# Patient Record
Sex: Female | Born: 1965 | Race: Black or African American | Hispanic: No | Marital: Married | Smoking: Never smoker
Health system: Southern US, Community
[De-identification: ages and names within clinical notes are randomized; demographics above are authoritative.]

## PROBLEM LIST (undated history)

## (undated) DIAGNOSIS — Z9889 Other specified postprocedural states: Secondary | ICD-10-CM

## (undated) DIAGNOSIS — T7840XA Allergy, unspecified, initial encounter: Secondary | ICD-10-CM

## (undated) DIAGNOSIS — D649 Anemia, unspecified: Secondary | ICD-10-CM

## (undated) DIAGNOSIS — E559 Vitamin D deficiency, unspecified: Secondary | ICD-10-CM

## (undated) DIAGNOSIS — K219 Gastro-esophageal reflux disease without esophagitis: Secondary | ICD-10-CM

## (undated) DIAGNOSIS — J45909 Unspecified asthma, uncomplicated: Secondary | ICD-10-CM

## (undated) DIAGNOSIS — R112 Nausea with vomiting, unspecified: Secondary | ICD-10-CM

## (undated) HISTORY — PX: HAMMER TOE SURGERY: SHX385

## (undated) HISTORY — DX: Vitamin D deficiency, unspecified: E55.9

## (undated) HISTORY — PX: ABDOMINAL HYSTERECTOMY: SHX81

## (undated) HISTORY — DX: Allergy, unspecified, initial encounter: T78.40XA

---

## 2011-08-09 ENCOUNTER — Ambulatory Visit: Payer: Self-pay | Admitting: Family Medicine

## 2012-09-19 ENCOUNTER — Ambulatory Visit: Payer: Self-pay | Admitting: Family Medicine

## 2012-09-21 ENCOUNTER — Ambulatory Visit: Payer: Self-pay | Admitting: Family Medicine

## 2014-12-24 ENCOUNTER — Other Ambulatory Visit: Payer: Self-pay | Admitting: Physician Assistant

## 2014-12-24 DIAGNOSIS — Z1231 Encounter for screening mammogram for malignant neoplasm of breast: Secondary | ICD-10-CM

## 2015-01-08 ENCOUNTER — Ambulatory Visit
Admission: RE | Admit: 2015-01-08 | Discharge: 2015-01-08 | Disposition: A | Discharge status: Home / Self Care | Payer: No Typology Code available for payment source | Source: Ambulatory Visit | Attending: Physician Assistant | Admitting: Physician Assistant

## 2015-01-08 DIAGNOSIS — Z1231 Encounter for screening mammogram for malignant neoplasm of breast: Secondary | ICD-10-CM | POA: Diagnosis present

## 2015-01-28 ENCOUNTER — Encounter
Admission: RE | Admit: 2015-01-28 | Discharge: 2015-01-28 | Disposition: A | Discharge status: Home / Self Care | Payer: No Typology Code available for payment source | Source: Ambulatory Visit | Attending: Obstetrics and Gynecology | Admitting: Obstetrics and Gynecology

## 2015-01-28 DIAGNOSIS — Z01812 Encounter for preprocedural laboratory examination: Secondary | ICD-10-CM | POA: Insufficient documentation

## 2015-01-28 HISTORY — DX: Gastro-esophageal reflux disease without esophagitis: K21.9

## 2015-01-28 HISTORY — DX: Unspecified asthma, uncomplicated: J45.909

## 2015-01-28 HISTORY — DX: Anemia, unspecified: D64.9

## 2015-01-28 LAB — TYPE AND SCREEN
ABO/RH(D): O POS
ANTIBODY SCREEN: NEGATIVE

## 2015-01-28 LAB — COMPREHENSIVE METABOLIC PANEL
ALK PHOS: 52 U/L (ref 38–126)
ALT: 12 U/L — AB (ref 14–54)
AST: 18 U/L (ref 15–41)
Albumin: 4.5 g/dL (ref 3.5–5.0)
Anion gap: 8 (ref 5–15)
BILIRUBIN TOTAL: 0.8 mg/dL (ref 0.3–1.2)
BUN: 11 mg/dL (ref 6–20)
CO2: 24 mmol/L (ref 22–32)
Calcium: 9.1 mg/dL (ref 8.9–10.3)
Chloride: 106 mmol/L (ref 101–111)
Creatinine, Ser: 0.63 mg/dL (ref 0.44–1.00)
GLUCOSE: 97 mg/dL (ref 65–99)
POTASSIUM: 3.7 mmol/L (ref 3.5–5.1)
SODIUM: 138 mmol/L (ref 135–145)
Total Protein: 7.4 g/dL (ref 6.5–8.1)

## 2015-01-28 LAB — CBC
HEMATOCRIT: 37 % (ref 35.0–47.0)
Hemoglobin: 12.3 g/dL (ref 12.0–16.0)
MCH: 28.8 pg (ref 26.0–34.0)
MCHC: 33.1 g/dL (ref 32.0–36.0)
MCV: 87.1 fL (ref 80.0–100.0)
Platelets: 209 10*3/uL (ref 150–440)
RBC: 4.25 MIL/uL (ref 3.80–5.20)
RDW: 13.1 % (ref 11.5–14.5)
WBC: 6.9 10*3/uL (ref 3.6–11.0)

## 2015-01-28 LAB — ABO/RH: ABO/RH(D): O POS

## 2015-01-28 NOTE — Patient Instructions (Signed)
  Your procedure is scheduled [id number] 5, 2016 (Tuesday) Report to Day Surgery. To find out your arrival time please call [phone] between 1PM - 3PM on January 30, 2015 (Friday).  Remember: Instructions that are not followed completely may result in serious medical risk, up to and including death, or upon the discretion of your surgeon and anesthesiologist your surgery may need to be rescheduled.    __x__ 1. Do not eat food or drink liquids after midnight. No gum chewing or hard candies.     __x__ 2. No Alcohol for 24 hours before or after surgery.   ____ 3. Bring all medications with you on the day of surgery if instructed.    __x__ 4. Notify your doctor if there is any change in your medical condition     (cold, fever, infections).     Do not wear jewelry, make-up, hairpins, clips or nail polish.  Do not wear lotions, powders, or perfumes. You may wear deodorant.  Do not shave 48 hours prior to surgery. Men may shave face and neck.  Do not bring valuables to the hospital.    [facility] is not responsible for any belongings or valuables.               Contacts, dentures or bridgework may not be worn into surgery.  Leave your suitcase in the car. After surgery it may be brought to your room.  For patients admitted to the hospital, discharge time is determined by your                treatment team.   Patients discharged the day of surgery will not be allowed to drive home.   Please read over the following fact sheets that you were given:   Surgical Site Infection Prevention   __x__ Take these medicines the morning of surgery with A SIP OF WATER:    1. Omeprazole (Omeprazole at bedtime on July 4)  2.   3.   4.  5.  6.  ____ Fleet Enema (as directed)   __x__ Use CHG Soap as directed  ____ Use inhalers on the day of surgery  ____ Stop metformin 2 days prior to surgery    ____ Take 1/2 of usual insulin dose the night before surgery and none on the morning of surgery.    ____ Stop Coumadin/Plavix/aspirin on   ____ Stop Anti-inflammatories on    ____ Stop supplements until after surgery.    ____ Bring C-Pap to the hospital.

## 2015-02-03 ENCOUNTER — Encounter
Admission: RE | Disposition: A | Discharge status: Home / Self Care | Payer: Self-pay | Source: Ambulatory Visit | Attending: Obstetrics and Gynecology

## 2015-02-03 ENCOUNTER — Ambulatory Visit
Admission: RE | Admit: 2015-02-03 | Discharge: 2015-02-03 | Disposition: A | Discharge status: Home / Self Care | Payer: PRIVATE HEALTH INSURANCE | Source: Ambulatory Visit | Attending: Obstetrics and Gynecology | Admitting: Obstetrics and Gynecology

## 2015-02-03 ENCOUNTER — Ambulatory Visit: Payer: PRIVATE HEALTH INSURANCE | Admitting: Certified Registered"

## 2015-02-03 ENCOUNTER — Encounter: Payer: Self-pay | Admitting: *Deleted

## 2015-02-03 DIAGNOSIS — N920 Excessive and frequent menstruation with regular cycle: Secondary | ICD-10-CM | POA: Insufficient documentation

## 2015-02-03 DIAGNOSIS — D649 Anemia, unspecified: Secondary | ICD-10-CM | POA: Insufficient documentation

## 2015-02-03 DIAGNOSIS — D259 Leiomyoma of uterus, unspecified: Secondary | ICD-10-CM | POA: Insufficient documentation

## 2015-02-03 DIAGNOSIS — K219 Gastro-esophageal reflux disease without esophagitis: Secondary | ICD-10-CM | POA: Insufficient documentation

## 2015-02-03 DIAGNOSIS — J45909 Unspecified asthma, uncomplicated: Secondary | ICD-10-CM | POA: Diagnosis not present

## 2015-02-03 DIAGNOSIS — N888 Other specified noninflammatory disorders of cervix uteri: Secondary | ICD-10-CM | POA: Diagnosis not present

## 2015-02-03 DIAGNOSIS — N838 Other noninflammatory disorders of ovary, fallopian tube and broad ligament: Secondary | ICD-10-CM | POA: Insufficient documentation

## 2015-02-03 HISTORY — PX: CYSTOSCOPY: SHX5120

## 2015-02-03 HISTORY — PX: LAPAROSCOPIC HYSTERECTOMY: SHX1926

## 2015-02-03 LAB — POCT PREGNANCY, URINE: Preg Test, Ur: NEGATIVE

## 2015-02-03 SURGERY — HYSTERECTOMY, TOTAL, LAPAROSCOPIC
Anesthesia: General

## 2015-02-03 MED ORDER — BUPIVACAINE HCL 0.5 % IJ SOLN
INTRAMUSCULAR | Status: DC | PRN
  Administered 2015-02-03: 15 mL

## 2015-02-03 MED ORDER — GLYCOPYRROLATE 0.2 MG/ML IJ SOLN
INTRAMUSCULAR | Status: DC | PRN
  Administered 2015-02-03: 0.6 mg via INTRAVENOUS

## 2015-02-03 MED ORDER — IBUPROFEN 600 MG PO TABS
600.0000 mg | ORAL_TABLET | Freq: Four times a day (QID) | ORAL | Status: DC | PRN
  Administered 2015-02-03: 600 mg via ORAL
  Filled 2015-02-03 (×2): qty 1

## 2015-02-03 MED ORDER — ONDANSETRON HCL 4 MG/2ML IJ SOLN
INTRAMUSCULAR | Status: AC
  Filled 2015-02-03: qty 2

## 2015-02-03 MED ORDER — OXYCODONE-ACETAMINOPHEN 5-325 MG PO TABS
1.0000 | ORAL_TABLET | ORAL | Status: DC | PRN
Start: 2015-02-03 — End: 2015-02-03
  Administered 2015-02-03: 1 via ORAL

## 2015-02-03 MED ORDER — MIDAZOLAM HCL 2 MG/2ML IJ SOLN
INTRAMUSCULAR | Status: DC | PRN
  Administered 2015-02-03: 2 mg via INTRAVENOUS

## 2015-02-03 MED ORDER — CEFAZOLIN SODIUM-DEXTROSE 2-3 GM-% IV SOLR
INTRAVENOUS | Status: AC
  Administered 2015-02-03: 2 g via INTRAVENOUS
  Filled 2015-02-03: qty 50

## 2015-02-03 MED ORDER — ROCURONIUM BROMIDE 100 MG/10ML IV SOLN
INTRAVENOUS | Status: DC | PRN
  Administered 2015-02-03: 50 mg via INTRAVENOUS

## 2015-02-03 MED ORDER — FENTANYL CITRATE (PF) 100 MCG/2ML IJ SOLN
INTRAMUSCULAR | Status: AC
  Administered 2015-02-03: 25 ug via INTRAVENOUS
  Filled 2015-02-03: qty 2

## 2015-02-03 MED ORDER — FENTANYL CITRATE (PF) 100 MCG/2ML IJ SOLN
INTRAMUSCULAR | Status: DC | PRN
  Administered 2015-02-03 (×2): 50 ug via INTRAVENOUS
  Administered 2015-02-03: 100 ug via INTRAVENOUS
  Administered 2015-02-03: 50 ug via INTRAVENOUS

## 2015-02-03 MED ORDER — IBUPROFEN 600 MG PO TABS: 600.0000 mg | ORAL_TABLET | Freq: Four times a day (QID) | ORAL | Status: DC | PRN

## 2015-02-03 MED ORDER — ACETAMINOPHEN 10 MG/ML IV SOLN
INTRAVENOUS | Status: DC | PRN
  Administered 2015-02-03: 1000 mg via INTRAVENOUS

## 2015-02-03 MED ORDER — ONDANSETRON 4 MG PO TBDP: 4.0000 mg | ORAL_TABLET | Freq: Three times a day (TID) | ORAL | Status: DC | PRN

## 2015-02-03 MED ORDER — LACTATED RINGERS IV SOLN
INTRAVENOUS | Status: DC
  Administered 2015-02-03 (×4): via INTRAVENOUS

## 2015-02-03 MED ORDER — ONDANSETRON HCL 4 MG/2ML IJ SOLN
4.0000 mg | Freq: Once | INTRAMUSCULAR | Status: AC | PRN
  Administered 2015-02-03: 4 mg via INTRAVENOUS

## 2015-02-03 MED ORDER — CEFAZOLIN SODIUM-DEXTROSE 2-3 GM-% IV SOLR
2.0000 g | INTRAVENOUS | Status: AC
  Administered 2015-02-03: 2 g via INTRAVENOUS

## 2015-02-03 MED ORDER — BUPIVACAINE HCL (PF) 0.5 % IJ SOLN
INTRAMUSCULAR | Status: AC
  Filled 2015-02-03: qty 30

## 2015-02-03 MED ORDER — NEOSTIGMINE METHYLSULFATE 10 MG/10ML IV SOLN
INTRAVENOUS | Status: DC | PRN
  Administered 2015-02-03: 3 mg via INTRAVENOUS

## 2015-02-03 MED ORDER — FENTANYL CITRATE (PF) 100 MCG/2ML IJ SOLN
25.0000 ug | INTRAMUSCULAR | Status: DC | PRN
  Administered 2015-02-03 (×4): 25 ug via INTRAVENOUS

## 2015-02-03 MED ORDER — OXYCODONE-ACETAMINOPHEN 5-325 MG PO TABS: 2.0000 | ORAL_TABLET | ORAL | Status: DC | PRN

## 2015-02-03 MED ORDER — ONDANSETRON HCL 4 MG/2ML IJ SOLN
INTRAMUSCULAR | Status: DC | PRN
  Administered 2015-02-03: 4 mg via INTRAVENOUS

## 2015-02-03 MED ORDER — IBUPROFEN 600 MG PO TABS
ORAL_TABLET | ORAL | Status: AC
  Filled 2015-02-03: qty 1

## 2015-02-03 MED ORDER — DEXAMETHASONE SODIUM PHOSPHATE 4 MG/ML IJ SOLN
INTRAMUSCULAR | Status: DC | PRN
  Administered 2015-02-03: 5 mg via INTRAVENOUS

## 2015-02-03 MED ORDER — ACETAMINOPHEN 10 MG/ML IV SOLN
INTRAVENOUS | Status: AC
  Filled 2015-02-03: qty 100

## 2015-02-03 MED ORDER — OXYCODONE-ACETAMINOPHEN 5-325 MG PO TABS
ORAL_TABLET | ORAL | Status: AC
  Filled 2015-02-03: qty 1

## 2015-02-03 MED ORDER — LIDOCAINE HCL (CARDIAC) 20 MG/ML IV SOLN
INTRAVENOUS | Status: DC | PRN
  Administered 2015-02-03: 50 mg via INTRAVENOUS

## 2015-02-03 MED ORDER — PROPOFOL 10 MG/ML IV BOLUS
INTRAVENOUS | Status: DC | PRN
  Administered 2015-02-03: 180 mg via INTRAVENOUS

## 2015-02-03 SURGICAL SUPPLY — 58 items
BAG URO DRAIN 2000ML W/SPOUT (MISCELLANEOUS) ×3 IMPLANT
BLADE SURG SZ11 CARB STEEL (BLADE) ×3 IMPLANT
CATH FOLEY 2WAY  5CC 16FR (CATHETERS) ×2
CATH ROBINSON RED A/P 16FR (CATHETERS) IMPLANT
CATH URTH 16FR FL 2W BLN LF (CATHETERS) ×1 IMPLANT
CHLORAPREP W/TINT 26ML (MISCELLANEOUS) ×3 IMPLANT
COVER LIGHT HANDLE STERIS (MISCELLANEOUS) ×6 IMPLANT
DEFOGGER SCOPE WARMER CLEARIFY (MISCELLANEOUS) ×3 IMPLANT
DEVICE SUTURE ENDOST 10MM (ENDOMECHANICALS) ×3 IMPLANT
DEVICE TROCAR PUNCTURE CLOSURE (ENDOMECHANICALS) ×3 IMPLANT
DRAPE LEGGINS SURG 28X43 STRL (DRAPES) ×3 IMPLANT
DRAPE SHEET LG 3/4 BI-LAMINATE (DRAPES) ×3 IMPLANT
DRAPE UNDER BUTTOCK W/FLU (DRAPES) ×3 IMPLANT
DRAPE XRAY CASSETTE 23X24 (DRAPES) IMPLANT
ENDOSTITCH 0 SINGLE 48 (SUTURE) IMPLANT
GLOVE BIO SURGEON STRL SZ7 (GLOVE) ×9 IMPLANT
GLOVE BIOGEL PI IND STRL 7.5 (GLOVE) ×3 IMPLANT
GLOVE BIOGEL PI INDICATOR 7.5 (GLOVE) ×6
GLOVE INDICATOR 7.5 STRL GRN (GLOVE) ×9 IMPLANT
GOWN STRL REUS W/ TWL LRG LVL3 (GOWN DISPOSABLE) ×3 IMPLANT
GOWN STRL REUS W/ TWL XL LVL3 (GOWN DISPOSABLE) ×1 IMPLANT
GOWN STRL REUS W/TWL LRG LVL3 (GOWN DISPOSABLE) ×6
GOWN STRL REUS W/TWL XL LVL3 (GOWN DISPOSABLE) ×2
GRASPER SUT TROCAR 14GX15 (MISCELLANEOUS) ×3 IMPLANT
IRRIGATION STRYKERFLOW (MISCELLANEOUS) ×1 IMPLANT
IRRIGATOR STRYKERFLOW (MISCELLANEOUS) ×3
IV LACTATED RINGERS 1000ML (IV SOLUTION) ×6 IMPLANT
JELLY LUB 2OZ STRL (MISCELLANEOUS) ×2
JELLY LUBE 2OZ STRL (MISCELLANEOUS) ×1 IMPLANT
KIT RM TURNOVER CYSTO AR (KITS) ×3 IMPLANT
LABEL OR SOLS (LABEL) ×3 IMPLANT
LIGASURE BLUNT 5MM 37CM (INSTRUMENTS) ×3 IMPLANT
LIQUID BAND (GAUZE/BANDAGES/DRESSINGS) ×3 IMPLANT
MANIPULATOR VCARE LG CRV RETR (MISCELLANEOUS) ×3 IMPLANT
MANIPULATOR VCARE STD CRV RETR (MISCELLANEOUS) IMPLANT
NDL SAFETY 22GX1.5 (NEEDLE) ×3 IMPLANT
OCCLUDER COLPOPNEUMO (BALLOONS) ×3 IMPLANT
PACK CYSTO AR (MISCELLANEOUS) ×3 IMPLANT
PACK LAP CHOLECYSTECTOMY (MISCELLANEOUS) ×3 IMPLANT
PAD OB MATERNITY 4.3X12.25 (PERSONAL CARE ITEMS) ×3 IMPLANT
PAD PREP 24X41 OB/GYN DISP (PERSONAL CARE ITEMS) ×3 IMPLANT
SCISSORS METZENBAUM CVD 33 (INSTRUMENTS) ×3 IMPLANT
SET CYSTO W/LG BORE CLAMP LF (SET/KITS/TRAYS/PACK) ×3 IMPLANT
SHEARS HARMONIC ACE PLUS 36CM (ENDOMECHANICALS) IMPLANT
SLEEVE ENDOPATH XCEL 5M (ENDOMECHANICALS) ×3 IMPLANT
SLEEVE PROTECTION STRL DISP (MISCELLANEOUS) ×3 IMPLANT
SOL PREP PVP 2OZ (MISCELLANEOUS) ×3
SOLUTION PREP PVP 2OZ (MISCELLANEOUS) ×1 IMPLANT
SPONGE LAP 18X18 5 PK (GAUZE/BANDAGES/DRESSINGS) ×3 IMPLANT
SPONGE XRAY 4X4 16PLY STRL (MISCELLANEOUS) ×3 IMPLANT
SUT ENDO VLOC 180-0-8IN (SUTURE) ×3 IMPLANT
SUT VIC AB 0 CT1 36 (SUTURE) IMPLANT
SYR 50ML LL SCALE MARK (SYRINGE) ×3 IMPLANT
SYRINGE 10CC LL (SYRINGE) ×6 IMPLANT
TROCAR ENDO BLADELESS 11MM (ENDOMECHANICALS) ×3 IMPLANT
TROCAR XCEL NON-BLD 5MMX100MML (ENDOMECHANICALS) ×3 IMPLANT
TUBING INSUFFLATOR HEATED (MISCELLANEOUS) ×3 IMPLANT
WATER STERILE IRR 3000ML UROMA (IV SOLUTION) ×3 IMPLANT

## 2015-02-03 NOTE — Anesthesia Procedure Notes (Signed)
Procedure Name: Intubation Date/Time: 02/03/2015 10:06 AM Performed by: Rolla Plate Pre-anesthesia Checklist: Patient identified, Patient being monitored, Timeout performed, Emergency Drugs available and Suction available Patient Re-evaluated:Patient Re-evaluated prior to inductionOxygen Delivery Method: Circle system utilized Preoxygenation: Pre-oxygenation with 100% oxygen Intubation Type: IV induction Ventilation: Mask ventilation without difficulty Laryngoscope Size: Miller and 2 Grade View: Grade I Tube type: Oral Tube size: 7.0 mm Number of attempts: 1 Airway Equipment and Method: Stylet Placement Confirmation: ETT inserted through vocal cords under direct vision,  positive ETCO2 and breath sounds checked- equal and bilateral Secured at: 21 cm Tube secured with: Tape Dental Injury: Teeth and Oropharynx as per pre-operative assessment

## 2015-02-03 NOTE — Anesthesia Preprocedure Evaluation (Signed)
Anesthesia Evaluation  Patient identified by MRN, date of birth, ID band Patient awake    Reviewed: Allergy & Precautions, NPO status , Patient's Chart, lab work & pertinent test results  Airway Mallampati: II  TM Distance: >3 FB Neck ROM: Full    Dental no notable dental hx. (+) Caps   Pulmonary asthma ,    Pulmonary exam normal       Cardiovascular negative cardio ROS Normal cardiovascular exam    Neuro/Psych negative neurological ROS  negative psych ROS   GI/Hepatic Neg liver ROS, GERD-  ,  Endo/Other  negative endocrine ROS  Renal/GU negative Renal ROS  negative genitourinary   Musculoskeletal negative musculoskeletal ROS (+)   Abdominal Normal abdominal exam  (+)   Peds negative pediatric ROS (+)  Hematology  (+) anemia ,   Anesthesia Other Findings   Reproductive/Obstetrics negative OB ROS                             Anesthesia Physical Anesthesia Plan  ASA: II  Anesthesia Plan: General   Post-op Pain Management:    Induction: Intravenous  Airway Management Planned: Oral ETT  Additional Equipment:   Intra-op Plan:   Post-operative Plan: Extubation in OR  Informed Consent: I have reviewed the patients History and Physical, chart, labs and discussed the procedure including the risks, benefits and alternatives for the proposed anesthesia with the patient or authorized representative who has indicated his/her understanding and acceptance.   Dental advisory given  Plan Discussed with: CRNA and Surgeon  Anesthesia Plan Comments:         Anesthesia Quick Evaluation

## 2015-02-03 NOTE — Discharge Instructions (Signed)

## 2015-02-03 NOTE — Op Note (Signed)
Operative Note   02/03/2015 12:32 PM  PRE-OP DIAGNOSIS:  1) menorrhagia 2) fibroid uterus   POST-OP DIAGNOSIS:  1) menorrhagia 2) fibroid uterus   SURGEON: Surgeon(s) and Role:    * Will Bonnet, MD - Primary    * Aletha Halim, MD - Assisting  ANESTHESIA: General ET  PROCEDURE: Procedure(s): 1) Removal of Intrauterine Device 2) HYSTERECTOMY TOTAL LAPAROSCOPIC/BILATERAL SALPINGECTOMY 3) CYSTOSCOPY   ESTIMATED BLOOD LOSS: 75 mL  DRAINS: None   TOTAL IV FLUIDS: 1,000 mL crystalloid  SPECIMENS:  1) Uterus with cervix 2) Bilateral fallopian tubes  COMPLICATIONS: None  DISPOSITION: PACU - hemodynamically stable.  CONDITION: stable  PROCEDURE IN DETAIL:  After informed consent was obtained, the patient was taken to the operating room where anesthesia was obtained without difficulty. The patient was positioned in the dorsal lithotomy position in [place] and her arms were carefully tucked at her sides and the usual precautions were taken.  She was prepped and draped in normal sterile fashion.  Time-out was performed and a Foley catheter was placed into the bladder.  The intrauterine device string was visualized and grasped with a ring forcep. Gentle traction was applied and the IUD was removed without incident. A standard VCare uterine manipulator was placed in the uterus without incident.    Attention was turned to the abdomen where after injection of local anesthesia a 5 mm supra-umbilical port was placed using directly visualization with the Optiview trocar method. Verification of entry into the abdominal cavity was obtained via opening pressures. The laparoscope was introduced and CO2 gas was infused for pneumoperitoneum to a pressure of 15 mm Hg.  Atraumatic entry was verified. A right 11 mm port and a left 5 mm port were placed under direct intra-abdominal camera visualization of the laparoscope without difficulty.  The patient was placed in Trendelenburg and the  bowel was displaced up into the upper abdomen.  Round ligaments were divided on each side with the LigaSure and the retroperitoneal space was opened bilaterally.  The ureters were identified and preserved.  The utero-ovarian ligaments were skeletonized, sealed and divided with the LigaSure device.  A bladder flap was created and the bladder was dissected down off the lower uterine segment and cervix using electrocautery.  The uterine arteries were skeletonized bilaterally, sealed and divided with the LigaSure device.  A colpotomy was performed circumferentially along the V-Care ring with electrocautery and the cervix was incised from the vagina and the specimen was removed through the vagina.  A pneumo balloon was placed in the vagina and the vaginal cuff was then closed in a running continuous fashion using the EndoStitch technique with 0 V-Lock suture with careful attention to include the vaginal cuff angles and the vaginal epithelium within the closure.  The remaining fallopian tube segments were removed along the mesosalpinx using the LigaSure without difficulty.  At this point cystoscopy was performed. The Foley catheter was removed and the cystoscope was introduced into the bladder. The entire bladder was visualized and efflux of urine from the bilateralureteral orifices was visualized. No defects were noted in the bladder. The cystoscope was removed and the Foley catheter was replaced.  Attention was again turned to the abdominal portion of the case and visualization of the vascular pedicles was undertaken. Hemostasis was observed. The intraperitoneal pressure was dropped, and all planes of dissection, vascular pedicles and the vaginal cuff were found to be hemostatic.  The right lateral trocar was removed and the fascia was closed using a fascial  closure device. Before the umbilical and left lateral trocars were removed the CO2 gas was released.  The skin incisions were closed with Indermil glue.  Prior  to leaving the operating room the Foley catheter was removed. The patient tolerated the procedure well.  Sponge, lap and needle counts were correct x2.  The patient was taken to recovery room in excellent condition.  Antibiotics: She received 2 g of Ancef IV within 1 hour of the beginning of the procedure.  VTE prophylaxis: The patient was wearing SCDs throughout the entire case.  Will Bonnet, MD, [place] 02/03/2015 12:41 PM

## 2015-02-03 NOTE — H&P (Signed)
History and Physical Interval Note:  Alexandria Anderson  has presented today for surgery, with the diagnosis of MENORRHAGIA  The various methods of treatment have been discussed with the patient and family. After consideration of risks, benefits and other options for treatment, the patient has consented to  Procedure(s): HYSTERECTOMY TOTAL LAPAROSCOPIC/BILATERAL SALPINGECTOMY (N/A) CYSTOSCOPY (N/A) as a surgical intervention .  The patient's history has been reviewed, patient examined, no change in status, stable for surgery.  I have reviewed the patient's chart and labs.  Questions were answered to the patient's satisfaction.    The patient does not take a beta blocker and one is not indicated for this surgery.  Will Bonnet, MD, [place] 02/03/2015 9:53 AM

## 2015-02-03 NOTE — Transfer of Care (Signed)
Immediate Anesthesia Transfer of Care Note  Patient: Alexandria Anderson  Procedure(s) Performed: Procedure(s): HYSTERECTOMY TOTAL LAPAROSCOPIC/BILATERAL SALPINGECTOMY (N/A) CYSTOSCOPY (N/A)  Patient Location: PACU  Anesthesia Type:General  Level of Consciousness: awake  Airway & Oxygen Therapy: Patient Spontanous Breathing and Patient connected to face mask oxygen  Post-op Assessment: Report given to RN  Post vital signs: Reviewed  Last Vitals:  Filed Vitals:   02/03/15 1248  BP: 124/81  Pulse: 76  Temp: 36.6 C  Resp:     Complications: No apparent anesthesia complications

## 2015-02-04 ENCOUNTER — Encounter: Payer: Self-pay | Admitting: Obstetrics and Gynecology

## 2015-02-04 LAB — SURGICAL PATHOLOGY

## 2015-02-04 NOTE — Anesthesia Postprocedure Evaluation (Signed)
  Anesthesia Post-op Note  Patient: Alexandria Anderson  Procedure(s) Performed: Procedure(s): HYSTERECTOMY TOTAL LAPAROSCOPIC/BILATERAL SALPINGECTOMY (N/A) CYSTOSCOPY (N/A)  Anesthesia type:General  Patient location: PACU  Post pain: Pain level controlled  Post assessment: Post-op Vital signs reviewed, Patient's Cardiovascular Status Stable, Respiratory Function Stable, Patent Airway and No signs of Nausea or vomiting  Post vital signs: Reviewed and stable  Last Vitals:  Filed Vitals:   02/03/15 1616  BP: 125/83  Pulse:   Temp:   Resp: 16    Level of consciousness: awake, alert  and patient cooperative  Complications: No apparent anesthesia complications

## 2015-03-09 ENCOUNTER — Other Ambulatory Visit: Payer: Self-pay | Admitting: Physician Assistant

## 2015-03-09 ENCOUNTER — Ambulatory Visit
Admission: RE | Admit: 2015-03-09 | Discharge: 2015-03-09 | Disposition: A | Discharge status: Home / Self Care | Payer: PRIVATE HEALTH INSURANCE | Source: Ambulatory Visit | Attending: Physician Assistant | Admitting: Physician Assistant

## 2015-03-09 DIAGNOSIS — M25551 Pain in right hip: Secondary | ICD-10-CM | POA: Diagnosis present

## 2015-03-09 DIAGNOSIS — M79641 Pain in right hand: Secondary | ICD-10-CM

## 2015-06-09 ENCOUNTER — Ambulatory Visit: Payer: Self-pay | Admitting: Physician Assistant

## 2015-06-09 ENCOUNTER — Encounter: Payer: Self-pay | Admitting: Physician Assistant

## 2015-06-09 VITALS — BP 110/70 | HR 92 | Temp 98.6°F

## 2015-06-09 DIAGNOSIS — M542 Cervicalgia: Secondary | ICD-10-CM

## 2015-06-09 MED ORDER — MELOXICAM 15 MG PO TABS: 15.0000 mg | ORAL_TABLET | Freq: Every day | ORAL | Status: DC

## 2015-06-09 MED ORDER — CYCLOBENZAPRINE HCL 10 MG PO TABS: 10.0000 mg | ORAL_TABLET | Freq: Three times a day (TID) | ORAL | Status: DC | PRN

## 2015-06-09 MED ORDER — BACLOFEN 10 MG PO TABS: 10.0000 mg | ORAL_TABLET | Freq: Three times a day (TID) | ORAL | Status: DC

## 2015-06-09 NOTE — Progress Notes (Signed)
S: c/o neck and shoulder pain, hx of chronic spasms in same area, tried flexeril without relief, no numbness or tingling, doesn't think she injured herself or is under more stress  O: vitals wnl, nad, cspine nontender, trapezious muscles tender, full rom of neck, n/v intact  A: muscle spasms, neck pain  P: use massage therapy, wet heat, mobic 15mg  qd, flexeril 10mg  tid if at home, baclofen 10mg  tid if at work

## 2015-07-17 ENCOUNTER — Ambulatory Visit: Payer: Self-pay | Admitting: Physician Assistant

## 2015-07-31 ENCOUNTER — Ambulatory Visit: Payer: Self-pay | Admitting: Physician Assistant

## 2015-07-31 ENCOUNTER — Encounter: Payer: Self-pay | Admitting: Physician Assistant

## 2015-07-31 VITALS — BP 110/70 | HR 91 | Temp 98.0°F

## 2015-07-31 DIAGNOSIS — J018 Other acute sinusitis: Secondary | ICD-10-CM

## 2015-07-31 MED ORDER — AZITHROMYCIN 250 MG PO TABS: ORAL_TABLET | ORAL | Status: DC

## 2015-07-31 MED ORDER — FLUTICASONE PROPIONATE 50 MCG/ACT NA SUSP: 2.0000 | Freq: Every day | NASAL | Status: DC

## 2015-07-31 NOTE — Progress Notes (Signed)
S: C/o runny nose and congestion for 3 days, no fever, chills, cp/sob, v/d; mucus is mainly clear, eyes have been runny.   Using otc meds:   O: PE: vitals wnl, nad,  perrl eomi, conjunctiva wnl, normocephalic, tms dull, nasal mucosa boggy and swollen, throat injected, neck supple no lymph, lungs c t a, cv rrr, neuro intact  A:  Acute sinusitis, allergies   P: flonase, pataday opth gtts, if worsening and mucus changes color use zpack; drink fluids, continue regular meds , use otc meds of choice, return if not improving in 5 days, return earlier if worsening

## 2015-08-29 ENCOUNTER — Other Ambulatory Visit: Payer: Self-pay | Admitting: Physician Assistant

## 2015-09-24 ENCOUNTER — Ambulatory Visit: Payer: Self-pay | Admitting: Physician Assistant

## 2015-09-25 ENCOUNTER — Encounter: Payer: Self-pay | Admitting: Physician Assistant

## 2015-09-25 ENCOUNTER — Ambulatory Visit: Payer: Self-pay | Admitting: Physician Assistant

## 2015-09-25 VITALS — BP 110/60 | HR 84 | Temp 98.5°F

## 2015-09-25 DIAGNOSIS — Z9109 Other allergy status, other than to drugs and biological substances: Secondary | ICD-10-CM

## 2015-09-25 DIAGNOSIS — Z299 Encounter for prophylactic measures, unspecified: Secondary | ICD-10-CM

## 2015-09-25 DIAGNOSIS — M25511 Pain in right shoulder: Secondary | ICD-10-CM

## 2015-09-25 MED ORDER — IBUPROFEN 600 MG PO TABS: 600.0000 mg | ORAL_TABLET | Freq: Four times a day (QID) | ORAL | Status: DC | PRN

## 2015-09-25 MED ORDER — CETIRIZINE-PSEUDOEPHEDRINE ER 5-120 MG PO TB12: 1.0000 | ORAL_TABLET | Freq: Two times a day (BID) | ORAL | Status: DC

## 2015-09-25 NOTE — Progress Notes (Signed)
S: c/o r shoulder pain, hurts with movement, will get better during the day, but at night when she rolls over on her shoulder or reaches out it really hurts, no swelling, no known injury, also ? Refill on ibuprofen 600 and zyrtec d  O: vitals wnl, nad, lungs c t a, cv rrr, r shoulder neg for bony tenderness, full rom, no spasms noted, n/v intact  A: acute bursitis, seasonal allergies  P: ibuprofen 600mg  , do not take if taking meloxicam; zyrtec, refer to sports med

## 2015-11-11 ENCOUNTER — Encounter: Payer: Self-pay | Admitting: Physician Assistant

## 2015-11-11 ENCOUNTER — Ambulatory Visit: Payer: Self-pay | Admitting: Physician Assistant

## 2015-11-11 VITALS — BP 100/60 | HR 75 | Temp 98.4°F

## 2015-11-11 DIAGNOSIS — J3089 Other allergic rhinitis: Secondary | ICD-10-CM

## 2015-11-11 MED ORDER — PSEUDOEPHEDRINE HCL 60 MG PO TABS: 60.0000 mg | ORAL_TABLET | Freq: Four times a day (QID) | ORAL | Status: DC | PRN

## 2015-11-11 MED ORDER — FEXOFENADINE HCL 180 MG PO TABS: 180.0000 mg | ORAL_TABLET | Freq: Every day | ORAL | Status: DC

## 2015-11-11 NOTE — Progress Notes (Signed)
S: C/o runny nose and congestion for 3 days, ear pressure, started using zyrtec and zyrtec d, now is really dried out, no fever, chills, cp/sob, v/d; mucus is clear; Using otc meds:   O: PE: vitals wnl, nad, perrl eomi, normocephalic, tms dull, nasal mucosa pale, boggy, swollen, throat injected, neck supple no lymph, lungs c t a, cv rrr, neuro intact  A:  Acute seasonal allergies, allergic rhinitis   P: allegra, stop zyrtec, or use only 1 a day on opposite time of day as allegra, flonase, rx for sudafed if congested, if not congested just use allergy med; drink fluids, continue regular meds , use otc meds of choice, return if not improving in 5 days, return earlier if worsening

## 2015-12-09 IMAGING — CR DG HIP (WITH OR WITHOUT PELVIS) 2-3V*R*
1 series · 3 of 3 positions shown · non-contrast
Comparison: None.

CLINICAL DATA: Initial encounter for two month history of hip pain
without injury.

EXAM:
DG HIP (WITH OR WITHOUT PELVIS) 2-3V RIGHT

[Series 1: view not recorded · 0.14mm/px · 3 of 3 slices shown]
[im 1/3]
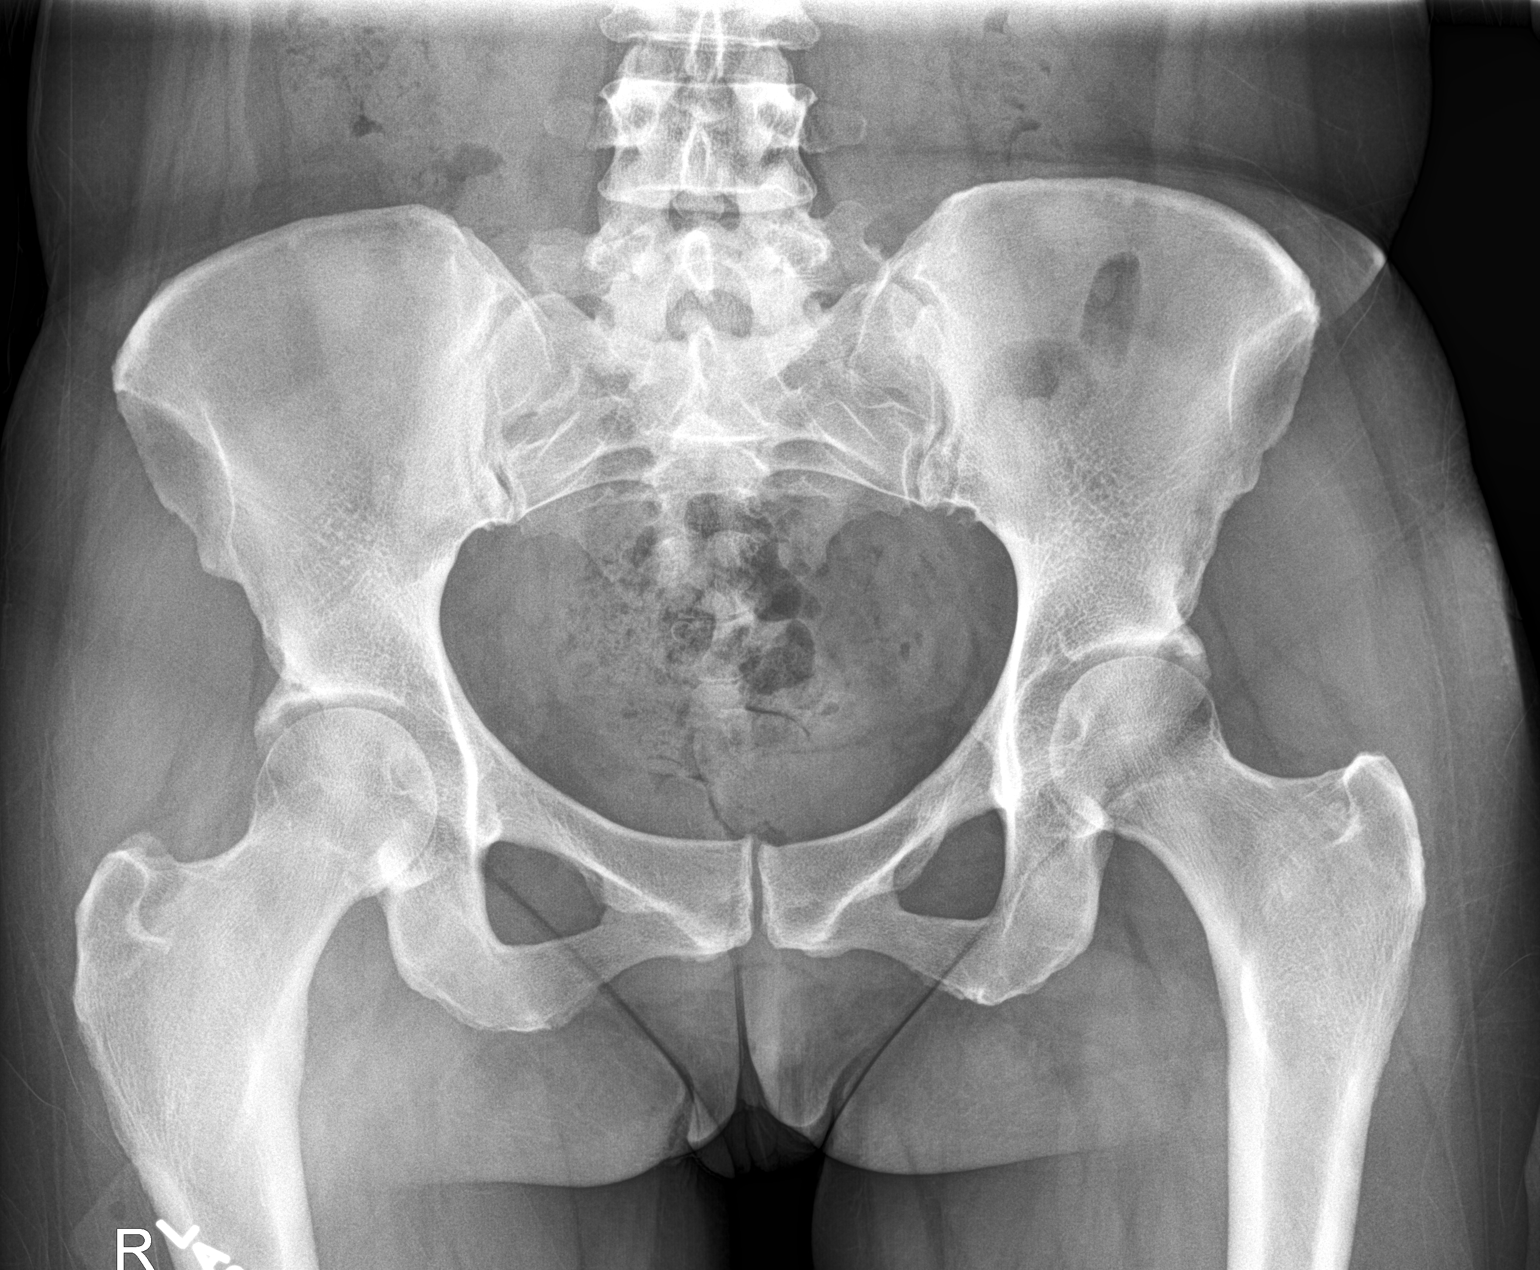
[im 2/3]
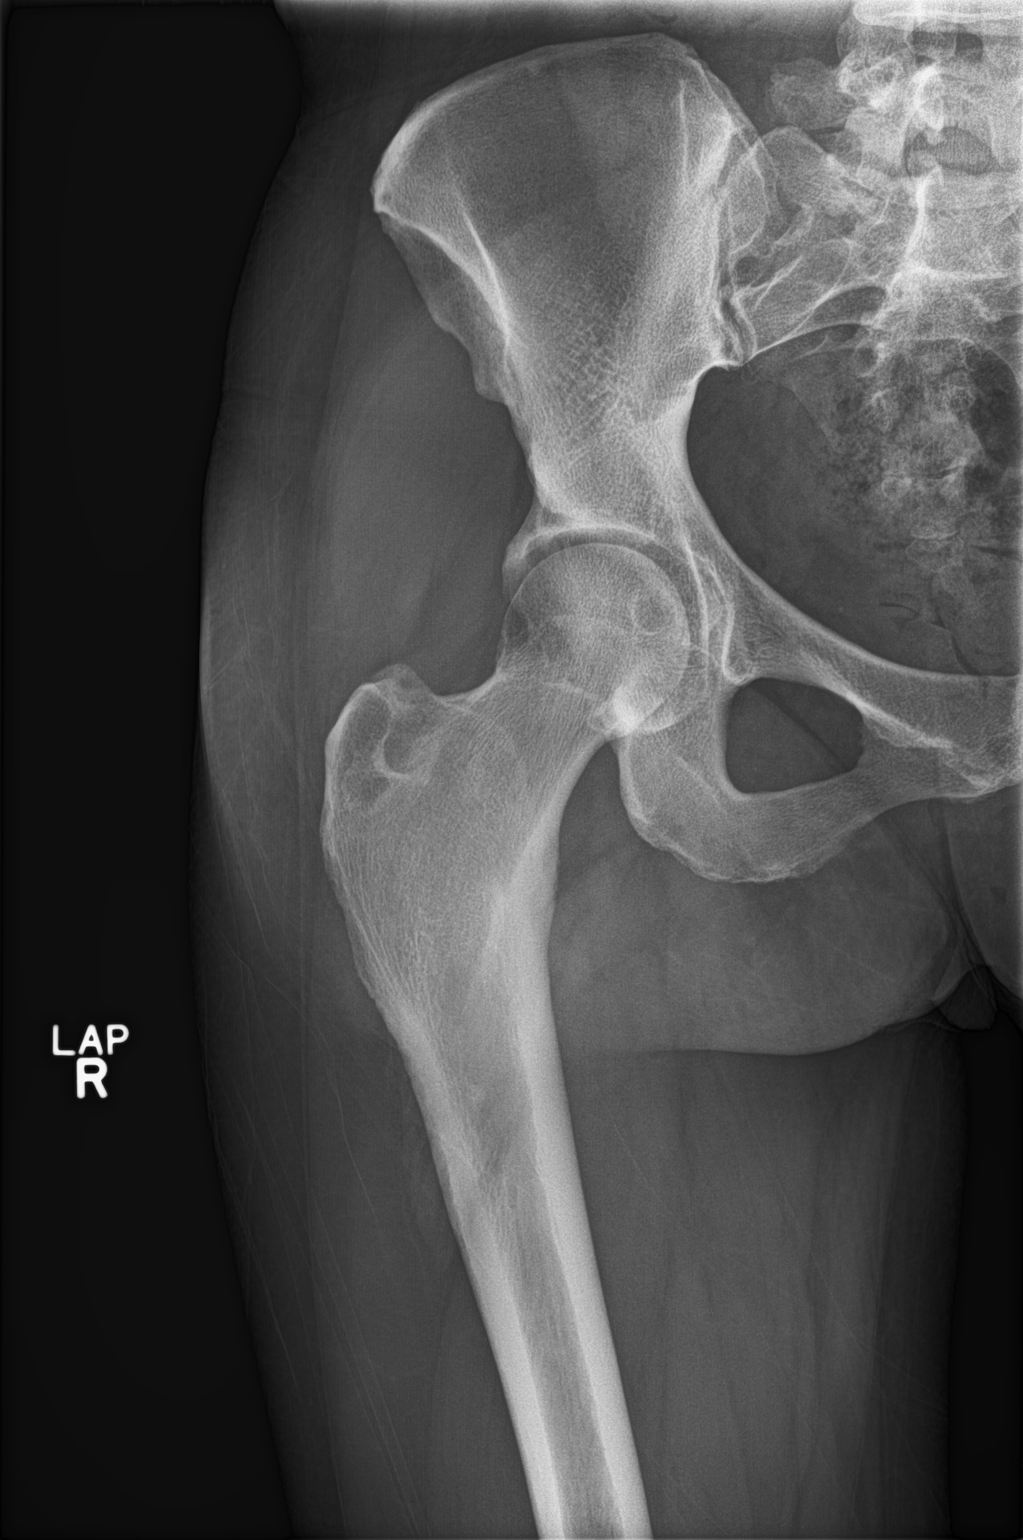
[im 3/3]
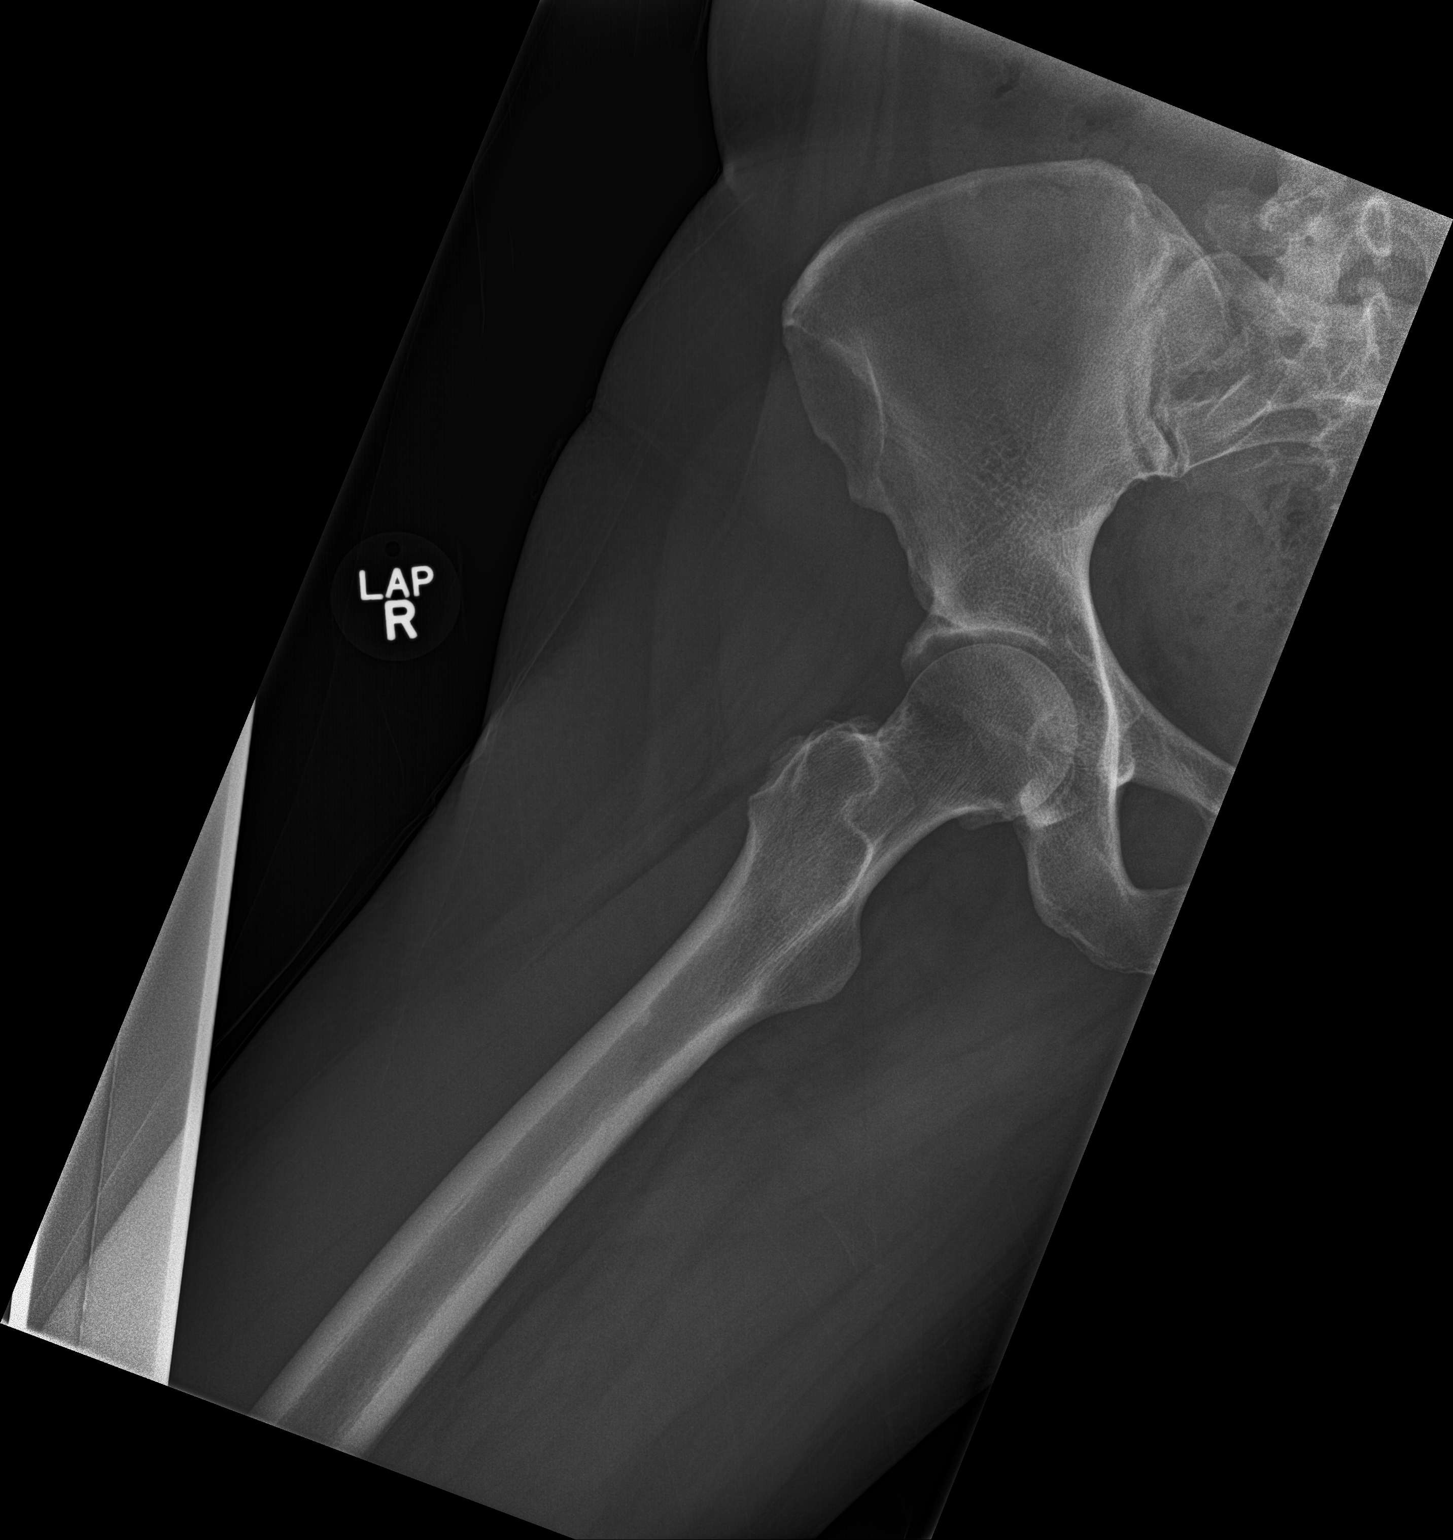

[3 of 3 positions shown; findings below may reference images not displayed]

FINDINGS: Frontal pelvis with AP and frog-leg lateral views of the right hip
show no fracture. Joint space in the hips is symmetric and
relatively well preserved. No substantial degenerative change. SI
joints and symphysis pubis are unremarkable.
IMPRESSION: Negative.

## 2015-12-14 ENCOUNTER — Other Ambulatory Visit: Payer: Self-pay | Admitting: Physician Assistant

## 2015-12-16 NOTE — Telephone Encounter (Signed)
Med refill approved 

## 2016-01-25 ENCOUNTER — Encounter: Payer: Self-pay | Admitting: Physician Assistant

## 2016-01-25 ENCOUNTER — Ambulatory Visit: Payer: Self-pay | Admitting: Physician Assistant

## 2016-01-25 VITALS — BP 122/70 | HR 94 | Temp 98.8°F

## 2016-01-25 DIAGNOSIS — B379 Candidiasis, unspecified: Secondary | ICD-10-CM

## 2016-01-25 MED ORDER — TERCONAZOLE 0.4 % VA CREA: 1.0000 | TOPICAL_CREAM | Freq: Every day | VAGINAL | Status: DC

## 2016-01-25 MED ORDER — ALIGN 4 MG PO CAPS: 1.0000 | ORAL_CAPSULE | Freq: Every morning | ORAL | Status: DC

## 2016-01-25 MED ORDER — FLUCONAZOLE 150 MG PO TABS: ORAL_TABLET | ORAL | Status: DC

## 2016-01-25 NOTE — Progress Notes (Signed)
S: here to discuss freq yeast infections, gyn doctor makes her come in to office every time she has yeast, has done multiple tests and its always yeast, got a bill from [facility] for 230$ after her last visit, did not do blood work, just vaginal swabs, ?if there is something else she can do, states she will take a diflucan and use otc monistat and it won't clear up, has to use terconazole suppositories, hasn't tried repeating the diflucan after a week, doesn't take a probiotic or eat yogurt daily, no hx of diabetes or hiv  O: vitals wnl, nad, lungs c t a, cv rrr  A: candidiasis by hx  P: otc align probiotic, diflucan 1 now and 1 in a week, if doesn't work get rx for terconazole filled, f/u with gyn

## 2016-02-03 ENCOUNTER — Encounter: Payer: Self-pay | Admitting: Physician Assistant

## 2016-02-03 ENCOUNTER — Ambulatory Visit: Payer: Self-pay | Admitting: Physician Assistant

## 2016-02-03 VITALS — BP 119/70 | HR 77 | Temp 98.8°F

## 2016-02-03 DIAGNOSIS — R1011 Right upper quadrant pain: Secondary | ICD-10-CM

## 2016-02-03 DIAGNOSIS — E559 Vitamin D deficiency, unspecified: Secondary | ICD-10-CM

## 2016-02-03 NOTE — Progress Notes (Signed)
S: c/o ruq pain, no fever/chills, some nausea, pain is under her rib and is constant, no fever/chills/uti sx; states is constant so can't tell if its related to food, sx for 1/5 weeks, still has gallbladder, no v/d  O: vitals wnl, nad, lungs c t a, cv rrr, abd soft nontender bs normal all 4 quads, n/v intact  A: ruq pain  P: [place] ruq, labs, refer to GI if needed

## 2016-02-04 LAB — CMP12+LP+TP+TSH+6AC+CBC/D/PLT
ALT: 11 IU/L (ref 0–32)
AST: 11 IU/L (ref 0–40)
Albumin/Globulin Ratio: 1.7 (ref 1.2–2.2)
Albumin: 4.7 g/dL (ref 3.5–5.5)
Alkaline Phosphatase: 61 IU/L (ref 39–117)
BASOS: 0 %
BUN / CREAT RATIO: 18 (ref 9–23)
BUN: 13 mg/dL (ref 6–24)
Basophils Absolute: 0 10*3/uL (ref 0.0–0.2)
Bilirubin Total: 0.3 mg/dL (ref 0.0–1.2)
CALCIUM: 9.1 mg/dL (ref 8.7–10.2)
Chloride: 102 mmol/L (ref 96–106)
Chol/HDL Ratio: 2.9 ratio units (ref 0.0–4.4)
Cholesterol, Total: 144 mg/dL (ref 100–199)
Creatinine, Ser: 0.73 mg/dL (ref 0.57–1.00)
EOS (ABSOLUTE): 0 10*3/uL (ref 0.0–0.4)
Eos: 1 %
Estimated CHD Risk: <0.5 times avg. (ref 0.0–1.0)
Free Thyroxine Index: 1.4 (ref 1.2–4.9)
GFR calc non Af Amer: 98 mL/min/{1.73_m2} (ref >59)
GFR, EST AFRICAN AMERICAN: 113 mL/min/{1.73_m2} (ref >59)
GGT: 38 IU/L (ref 0–60)
GLOBULIN, TOTAL: 2.8 g/dL (ref 1.5–4.5)
GLUCOSE: 84 mg/dL (ref 65–99)
HDL: 50 mg/dL (ref >39)
HEMATOCRIT: 37.3 % (ref 34.0–46.6)
Hemoglobin: 12.7 g/dL (ref 11.1–15.9)
IMMATURE GRANS (ABS): 0 10*3/uL (ref 0.0–0.1)
Immature Granulocytes: 0 %
Iron: 63 ug/dL (ref 27–159)
LDH: 155 IU/L (ref 119–226)
LDL CALC: 81 mg/dL (ref 0–99)
LYMPHS: 38 %
Lymphocytes Absolute: 2.7 10*3/uL (ref 0.7–3.1)
MCH: 28.7 pg (ref 26.6–33.0)
MCHC: 34 g/dL (ref 31.5–35.7)
MCV: 84 fL (ref 79–97)
MONOCYTES: 6 %
MONOS ABS: 0.4 10*3/uL (ref 0.1–0.9)
Neutrophils Absolute: 3.9 10*3/uL (ref 1.4–7.0)
Neutrophils: 55 %
POTASSIUM: 4.5 mmol/L (ref 3.5–5.2)
Phosphorus: 3.1 mg/dL (ref 2.5–4.5)
Platelets: 254 10*3/uL (ref 150–379)
RBC: 4.42 x10E6/uL (ref 3.77–5.28)
RDW: 14.6 % (ref 12.3–15.4)
SODIUM: 138 mmol/L (ref 134–144)
T3 Uptake Ratio: 27 % (ref 24–39)
T4, Total: 5.2 ug/dL (ref 4.5–12.0)
TRIGLYCERIDES: 64 mg/dL (ref 0–149)
TSH: 0.752 u[IU]/mL (ref 0.450–4.500)
Total Protein: 7.5 g/dL (ref 6.0–8.5)
Uric Acid: 4.2 mg/dL (ref 2.5–7.1)
VLDL Cholesterol Cal: 13 mg/dL (ref 5–40)
WBC: 7 10*3/uL (ref 3.4–10.8)

## 2016-02-04 LAB — LIPASE: Lipase: 27 U/L (ref 0–59)

## 2016-02-04 LAB — VITAMIN D 25 HYDROXY (VIT D DEFICIENCY, FRACTURES): Vit D, 25-Hydroxy: 12.7 ng/mL — ABNORMAL LOW (ref 30.0–100.0)

## 2016-02-05 ENCOUNTER — Ambulatory Visit
Admission: RE | Admit: 2016-02-05 | Discharge: 2016-02-05 | Disposition: A | Discharge status: Home / Self Care | Payer: Managed Care, Other (non HMO) | Source: Ambulatory Visit | Attending: Physician Assistant | Admitting: Physician Assistant

## 2016-02-05 DIAGNOSIS — R1011 Right upper quadrant pain: Secondary | ICD-10-CM | POA: Insufficient documentation

## 2016-02-05 NOTE — Addendum Note (Signed)
Addended by: Rudene Anda T on: 02/05/2016 02:57 PM   Modules accepted: Orders

## 2016-02-18 ENCOUNTER — Encounter: Payer: Self-pay | Admitting: Physician Assistant

## 2016-02-18 ENCOUNTER — Ambulatory Visit: Payer: Self-pay | Admitting: Physician Assistant

## 2016-02-18 VITALS — BP 100/70 | HR 97 | Temp 98.6°F

## 2016-02-18 DIAGNOSIS — J069 Acute upper respiratory infection, unspecified: Secondary | ICD-10-CM

## 2016-02-18 MED ORDER — PSEUDOEPH-BROMPHEN-DM 30-2-10 MG/5ML PO SYRP: 5.0000 mL | ORAL_SOLUTION | Freq: Four times a day (QID) | ORAL | Status: DC | PRN

## 2016-02-18 NOTE — Progress Notes (Signed)
   Subjective:cough    Patient ID: Alexandria Anderson, female    DOB: 18-Dec-1965, 50 y.o.   MRN: [id number]  HPI Patient c/o non-productive cough for 3 days. Recently return from trip with prolong stay at airport where the air-condition was very cold. States intermitting nasal congestion and running nose. Denies fever. Denies N/V/D. No palliative measure for compliant.   Review of Systems    Negative except for compliant. Objective:   Physical Exam HEENT reveal edematous nasal turbinates, clear rhinorreha, and post nasal drainage. Neck supple without adenopathy. Lung CTA and Heart RRR.       Assessment & Plan:URI  Bromfed DM. Follow up 3-5 days if no improvement.

## 2016-03-03 ENCOUNTER — Ambulatory Visit: Payer: Self-pay | Admitting: Gastroenterology

## 2016-05-15 ENCOUNTER — Other Ambulatory Visit: Payer: Self-pay | Admitting: Physician Assistant

## 2016-05-15 DIAGNOSIS — M542 Cervicalgia: Secondary | ICD-10-CM

## 2016-05-16 NOTE — Telephone Encounter (Signed)
Med refill approved, pt has chronic neck and back spasms

## 2016-07-21 ENCOUNTER — Encounter: Payer: Self-pay | Admitting: Physician Assistant

## 2016-07-21 ENCOUNTER — Ambulatory Visit: Payer: Self-pay | Admitting: Physician Assistant

## 2016-07-21 VITALS — BP 100/65 | HR 78 | Temp 98.3°F

## 2016-07-21 DIAGNOSIS — N951 Menopausal and female climacteric states: Secondary | ICD-10-CM

## 2016-07-21 MED ORDER — VENLAFAXINE HCL ER 37.5 MG PO CP24: 37.5000 mg | ORAL_CAPSULE | Freq: Every day | ORAL | 6 refills | Status: DC

## 2016-07-21 NOTE — Progress Notes (Signed)
S: c/o eyes are always tired and has pressure, also having severe hot flashes, had partial hyst last year, night sweats are waking her up several times a night, tried otc options like black cohash and essential oils which have not helped  O: vitals wnl, nad, perrl eomi tm clear, nasal mucosa wnl, throat wnl, neck supple no lymph lungs c t a c vrrr  A: hot flashes due to perimenopausal  P: effexor 37.5mg 

## 2016-08-04 ENCOUNTER — Ambulatory Visit: Payer: Self-pay | Admitting: Physician Assistant

## 2016-08-04 ENCOUNTER — Encounter: Payer: Self-pay | Admitting: Physician Assistant

## 2016-08-04 VITALS — BP 110/70 | HR 114 | Temp 99.8°F

## 2016-08-04 DIAGNOSIS — J069 Acute upper respiratory infection, unspecified: Secondary | ICD-10-CM

## 2016-08-04 LAB — POCT INFLUENZA A/B
INFLUENZA A, POC: NEGATIVE
Influenza B, POC: NEGATIVE

## 2016-08-04 MED ORDER — AZITHROMYCIN 250 MG PO TABS: ORAL_TABLET | ORAL | 0 refills | Status: DC

## 2016-08-04 MED ORDER — HYDROCOD POLST-CPM POLST ER 10-8 MG/5ML PO SUER: 5.0000 mL | Freq: Two times a day (BID) | ORAL | 0 refills | Status: DC | PRN

## 2016-08-04 NOTE — Progress Notes (Signed)
S: C/o runny nose and congestion with dry cough for 3 days, + fever, chills, denies cp/sob, v/d; mucus was green this am but clear throughout the day, cough is sporadic,   Using otc meds: robitussin  O: PE: vitals wnl, nad,  perrl eomi, normocephalic, tms dull, nasal mucosa red and swollen, throat injected, neck supple no lymph, lungs c t a, cv rrr, neuro intact, flu swab neg  A:  Acute uri   P: drink fluids, continue regular meds , use otc meds of choice, return if not improving in 5 days, return earlier if worsening , zpack, tussionex

## 2016-08-29 ENCOUNTER — Other Ambulatory Visit: Payer: Self-pay | Admitting: Physician Assistant

## 2016-08-30 NOTE — Telephone Encounter (Signed)
Med refill for omeprazole approved, denies metronidazole

## 2016-09-02 ENCOUNTER — Encounter: Payer: Self-pay | Admitting: Physician Assistant

## 2016-09-02 ENCOUNTER — Telehealth: Payer: Self-pay | Admitting: Gastroenterology

## 2016-09-02 ENCOUNTER — Ambulatory Visit: Payer: Self-pay | Admitting: Physician Assistant

## 2016-09-02 VITALS — BP 100/60 | HR 84 | Temp 98.4°F

## 2016-09-02 DIAGNOSIS — L709 Acne, unspecified: Secondary | ICD-10-CM

## 2016-09-02 DIAGNOSIS — K921 Melena: Secondary | ICD-10-CM

## 2016-09-02 MED ORDER — METRONIDAZOLE 500 MG PO TABS: 500.0000 mg | ORAL_TABLET | Freq: Three times a day (TID) | ORAL | 5 refills | Status: DC

## 2016-09-02 NOTE — Telephone Encounter (Signed)
Left voice message for patient to call and schedule appointment with GI for Blood in stool. Referred by [facility].

## 2016-09-02 NOTE — Progress Notes (Signed)
S: here for 1.  Med refill on flaygyl that she uses for acne flares 2. Cough, no fever, chills, cp/sob, states the cough is just occasional but thinks its from pnd, some facial pressure, took zyrtec d with relief, 3. Having bright red blood with bowel movements, last time there was a lot of blood in the toilet, several episodes of the same, getting concerned, not sure its just a hemorrhoid, has family member that has colon cancer,   O: vitals wnl, nad, ENT wnl, neck supple no lymph, lungs c t a, cv rrr, skin intact no redness, rectal exam deferred by pt   A: med refill for acne, cough, blood in stools  P: refill on flagyl, continue zyrtec refer to GI for eval of bloody stools, rectal bleeding

## 2016-09-22 ENCOUNTER — Other Ambulatory Visit: Payer: Self-pay | Admitting: Physician Assistant

## 2016-09-22 DIAGNOSIS — M542 Cervicalgia: Secondary | ICD-10-CM

## 2016-09-23 NOTE — Telephone Encounter (Signed)
Med refill for baclofen and mobic approved

## 2016-09-27 ENCOUNTER — Ambulatory Visit (INDEPENDENT_AMBULATORY_CARE_PROVIDER_SITE_OTHER): Payer: Managed Care, Other (non HMO) | Admitting: Gastroenterology

## 2016-09-27 ENCOUNTER — Other Ambulatory Visit: Payer: Self-pay

## 2016-09-27 DIAGNOSIS — K921 Melena: Secondary | ICD-10-CM | POA: Diagnosis not present

## 2016-09-28 ENCOUNTER — Encounter: Payer: Self-pay | Admitting: Gastroenterology

## 2016-09-28 NOTE — Progress Notes (Signed)
Gastroenterology Consultation  Referring Provider:     Ashok Cordia PA-C Primary Care Physician:  No PCP Per Patient Primary Gastroenterologist:  Dr. Allen Norris     Reason for Consultation:     Hematochezia        HPI:   Alexandria Anderson is a 51 y.o. y/o female referred for consultation & management of Hematochezia by Dr. Rayne Du PCP Per Patient.  This patient comes in today with a report of having rectal bleeding. The patient reports that the blood was mixed with the stools and on the stools. The patient states that she thought it was a large amount of rectal bleeding. The patient denies any recent episodes and states this happened a few weeks ago. The patient is concerned about the rectal bleeding. The patient denies any family history of colon cancer colon polyps. She also denies ever having rectal bleeding before. The patient has had a total hysterectomy so she knows that the bleeding was not vaginal. She denies any abdominal pain associated with the bleeding and denies any unexplained weight loss. The patient also denies any change in bowel habits.   Past Medical History:  Diagnosis Date  . Anemia   . Asthma    as a child  . GERD (gastroesophageal reflux disease)     Past Surgical History:  Procedure Laterality Date  . CYSTOSCOPY N/A 02/03/2015   Procedure: CYSTOSCOPY;  Surgeon: Will Bonnet, MD;  Location: [facility] ORS;  Service: Gynecology;  Laterality: N/A;  . HAMMER TOE SURGERY Bilateral   . LAPAROSCOPIC HYSTERECTOMY N/A 02/03/2015   Procedure: HYSTERECTOMY TOTAL LAPAROSCOPIC/BILATERAL SALPINGECTOMY;  Surgeon: Will Bonnet, MD;  Location: [facility] ORS;  Service: Gynecology;  Laterality: N/A;    Prior to Admission medications   Medication Sig Start Date End Date Taking? Authorizing Provider  baclofen (LIORESAL) 10 MG tablet TAKE 1 TABLET (10 MG TOTAL) BY MOUTH 3 (THREE) TIMES DAILY. FOR MUSCLE SPASMS 09/23/16   Versie Starks, PA-C  cetirizine-pseudoephedrine (ZYRTEC-D) 5-120 MG tablet  Take 1 tablet by mouth 2 (two) times daily. 09/25/15   Versie Starks, PA-C  fexofenadine (ALLEGRA) 180 MG tablet Take 1 tablet (180 mg total) by mouth daily. 11/11/15   Versie Starks, PA-C  fluticasone (FLONASE) 50 MCG/ACT nasal spray Place 2 sprays into both nostrils daily. 07/31/15   Versie Starks, PA-C  ibuprofen (ADVIL,MOTRIN) 600 MG tablet TAKE 1 TABLET BY MOUTH EVERY 6 HOURS AS NEEDED FOR MILD PAIN OR CRAMPING 12/16/15   Versie Starks, PA-C  meloxicam (MOBIC) 15 MG tablet TAKE 1 TABLET (15 MG TOTAL) BY MOUTH DAILY. 09/23/16   Versie Starks, PA-C  metroNIDAZOLE (FLAGYL) 500 MG tablet Take 1 tablet (500 mg total) by mouth 3 (three) times daily. 09/02/16   Versie Starks, PA-C  omeprazole (PRILOSEC) 20 MG capsule TAKE ONE CAPSULE BY MOUTH EVERY DAY 08/30/16   Versie Starks, PA-C  Probiotic Product (ALIGN) 4 MG CAPS Take 1 capsule (4 mg total) by mouth AC breakfast. 01/25/16   Versie Starks, PA-C  venlafaxine XR (EFFEXOR-XR) 37.5 MG 24 hr capsule Take 1 capsule (37.5 mg total) by mouth daily with breakfast. 07/21/16   Versie Starks, PA-C  Vitamin D, Ergocalciferol, (DRISDOL) 50000 UNITS CAPS capsule Take 50,000 Units by mouth every 7 (seven) days.    Historical Provider, MD    Family History  Problem Relation Age of Onset  . Hypertension Mother   . Hypercholesterolemia Mother  Social History  Substance Use Topics  . Smoking status: Never Smoker  . Smokeless tobacco: Never Used  . Alcohol use Not on file     Comment: occ    Allergies as of 09/27/2016  . (No Known Allergies)    Review of Systems:    All systems reviewed and negative except where noted in HPI.   Physical Exam:  LMP 12/30/2014 (Approximate)  Patient's last menstrual period was 12/30/2014 (approximate). Psych:  Alert and cooperative. Normal mood and affect. General:   Alert,  Well-developed, well-nourished, pleasant and cooperative in NAD Head:  Normocephalic and atraumatic. Eyes:  Sclera clear, no icterus.    Conjunctiva pink. Ears:  Normal auditory acuity. Nose:  No deformity, discharge, or lesions. Mouth:  No deformity or lesions,oropharynx pink & moist. Neck:  Supple; no masses or thyromegaly. Lungs:  Respirations even and unlabored.  Clear throughout to auscultation.   No wheezes, crackles, or rhonchi. No acute distress. Heart:  Regular rate and rhythm; no murmurs, clicks, rubs, or gallops. Abdomen:  Normal bowel sounds.  No bruits.  Soft, non-tender and non-distended without masses, hepatosplenomegaly or hernias noted.  No guarding or rebound tenderness.  Negative Carnett sign.   Rectal:  Deferred.  Msk:  Symmetrical without gross deformities.  Good, equal movement & strength bilaterally. Pulses:  Normal pulses noted. Extremities:  No clubbing or edema.  No cyanosis. Neurologic:  Alert and oriented x3;  grossly normal neurologically. Skin:  Intact without significant lesions or rashes.  No jaundice. Lymph Nodes:  No significant cervical adenopathy. Psych:  Alert and cooperative. Normal mood and affect.  Imaging Studies: No results found.  Assessment and Plan:   [name] is a 51 y.o. y/o female Who comes in today with a history of rectal bleeding. The patient has not had any change in bowel habits weight loss fevers chills nausea or vomiting associated with rectal bleeding she also denies any abdominal pain. The patient will be set up for colonoscopy to rule out any neoplasm as the cause of her rectal bleeding. I have discussed risks & benefits which include, but are not limited to, bleeding, infection, perforation & drug reaction.  The patient agrees with this plan & written consent will be obtained.      Lucilla Lame, MD. Marval Regal   Note: This dictation was prepared with Dragon dictation along with smaller phrase technology. Any transcriptional errors that result from this process are unintentional.

## 2016-10-05 ENCOUNTER — Other Ambulatory Visit: Payer: Self-pay | Admitting: Physician Assistant

## 2016-10-05 DIAGNOSIS — Z9109 Other allergy status, other than to drugs and biological substances: Secondary | ICD-10-CM

## 2016-10-06 ENCOUNTER — Other Ambulatory Visit: Payer: Self-pay

## 2016-10-06 DIAGNOSIS — Z1211 Encounter for screening for malignant neoplasm of colon: Secondary | ICD-10-CM

## 2016-10-06 NOTE — Telephone Encounter (Signed)
Med refill for allergy med approved

## 2016-10-07 ENCOUNTER — Encounter
Admission: RE | Disposition: A | Discharge status: Home / Self Care | Payer: Self-pay | Source: Ambulatory Visit | Attending: Gastroenterology

## 2016-10-07 ENCOUNTER — Ambulatory Visit: Payer: Managed Care, Other (non HMO) | Admitting: Certified Registered"

## 2016-10-07 ENCOUNTER — Encounter: Payer: Self-pay | Admitting: *Deleted

## 2016-10-07 ENCOUNTER — Ambulatory Visit
Admission: RE | Admit: 2016-10-07 | Discharge: 2016-10-07 | Disposition: A | Discharge status: Home / Self Care | Payer: Managed Care, Other (non HMO) | Source: Ambulatory Visit | Attending: Gastroenterology | Admitting: Gastroenterology

## 2016-10-07 DIAGNOSIS — K641 Second degree hemorrhoids: Secondary | ICD-10-CM | POA: Diagnosis not present

## 2016-10-07 DIAGNOSIS — K219 Gastro-esophageal reflux disease without esophagitis: Secondary | ICD-10-CM | POA: Diagnosis not present

## 2016-10-07 DIAGNOSIS — K921 Melena: Secondary | ICD-10-CM | POA: Insufficient documentation

## 2016-10-07 DIAGNOSIS — Z7951 Long term (current) use of inhaled steroids: Secondary | ICD-10-CM | POA: Diagnosis not present

## 2016-10-07 DIAGNOSIS — Z79899 Other long term (current) drug therapy: Secondary | ICD-10-CM | POA: Diagnosis not present

## 2016-10-07 DIAGNOSIS — Z791 Long term (current) use of non-steroidal anti-inflammatories (NSAID): Secondary | ICD-10-CM | POA: Insufficient documentation

## 2016-10-07 HISTORY — DX: Other specified postprocedural states: R11.2

## 2016-10-07 HISTORY — DX: Other specified postprocedural states: Z98.890

## 2016-10-07 HISTORY — PX: COLONOSCOPY WITH PROPOFOL: SHX5780

## 2016-10-07 SURGERY — COLONOSCOPY WITH PROPOFOL
Anesthesia: Monitor Anesthesia Care | Wound class: Contaminated

## 2016-10-07 MED ORDER — LACTATED RINGERS IV SOLN
INTRAVENOUS | Status: DC
  Administered 2016-10-07: 11:00:00 via INTRAVENOUS

## 2016-10-07 MED ORDER — STERILE WATER FOR IRRIGATION IR SOLN
Status: DC | PRN
  Administered 2016-10-07: 11:00:00

## 2016-10-07 MED ORDER — OXYCODONE HCL 5 MG PO TABS: 5.0000 mg | ORAL_TABLET | Freq: Once | ORAL | Status: DC | PRN

## 2016-10-07 MED ORDER — OXYCODONE HCL 5 MG/5ML PO SOLN: 5.0000 mg | Freq: Once | ORAL | Status: DC | PRN

## 2016-10-07 MED ORDER — PROPOFOL 10 MG/ML IV BOLUS
INTRAVENOUS | Status: DC | PRN
  Administered 2016-10-07: 100 mg via INTRAVENOUS
  Administered 2016-10-07: 50 mg via INTRAVENOUS

## 2016-10-07 MED ORDER — LIDOCAINE HCL (CARDIAC) 20 MG/ML IV SOLN
INTRAVENOUS | Status: DC | PRN
  Administered 2016-10-07: 50 mg via INTRAVENOUS

## 2016-10-07 SURGICAL SUPPLY — 23 items

## 2016-10-07 NOTE — Anesthesia Postprocedure Evaluation (Signed)
Anesthesia Post Note  Patient: Alexandria Anderson  Procedure(s) Performed: Procedure(s) (LRB): COLONOSCOPY WITH PROPOFOL (N/A)  Patient location during evaluation: PACU Anesthesia Type: MAC Level of consciousness: awake and alert Pain management: pain level controlled Vital Signs Assessment: post-procedure vital signs reviewed and stable Respiratory status: spontaneous breathing, nonlabored ventilation, respiratory function stable and patient connected to nasal cannula oxygen Cardiovascular status: stable and blood pressure returned to baseline Anesthetic complications: no    Phares Zaccone

## 2016-10-07 NOTE — Anesthesia Procedure Notes (Signed)
Procedure Name: MAC Performed by: Brayan Votaw Pre-anesthesia Checklist: Patient identified, Emergency Drugs available, Suction available, Timeout performed and Patient being monitored Patient Re-evaluated:Patient Re-evaluated prior to inductionOxygen Delivery Method: Nasal cannula Placement Confirmation: positive ETCO2     

## 2016-10-07 NOTE — Transfer of Care (Signed)
Immediate Anesthesia Transfer of Care Note  Patient: Alexandria Anderson  Procedure(s) Performed: Procedure(s): COLONOSCOPY WITH PROPOFOL (N/A)  Patient Location: PACU  Anesthesia Type: MAC  Level of Consciousness: awake, alert  and patient cooperative  Airway and Oxygen Therapy: Patient Spontanous Breathing and Patient connected to supplemental oxygen  Post-op Assessment: Post-op Vital signs reviewed, Patient's Cardiovascular Status Stable, Respiratory Function Stable, Patent Airway and No signs of Nausea or vomiting  Post-op Vital Signs: Reviewed and stable  Complications: No apparent anesthesia complications

## 2016-10-07 NOTE — Anesthesia Preprocedure Evaluation (Signed)
Anesthesia Evaluation  Patient identified by MRN, date of birth, ID band  Reviewed: NPO status   History of Anesthesia Complications (+) PONV and history of anesthetic complications (x1 ponv)  Airway Mallampati: II  TM Distance: >3 FB Neck ROM: full    Dental no notable dental hx.    Pulmonary neg pulmonary ROS,    Pulmonary exam normal        Cardiovascular Exercise Tolerance: Good negative cardio ROS Normal cardiovascular exam     Neuro/Psych negative neurological ROS  negative psych ROS   GI/Hepatic Neg liver ROS, GERD  Medicated,  Endo/Other  negative endocrine ROS  Renal/GU negative Renal ROS  negative genitourinary   Musculoskeletal  (+) Arthritis ,   Abdominal   Peds  Hematology negative hematology ROS (+)   Anesthesia Other Findings   Reproductive/Obstetrics negative OB ROS                             Anesthesia Physical Anesthesia Plan  ASA: II  Anesthesia Plan: MAC   Post-op Pain Management:    Induction:   Airway Management Planned:   Additional Equipment:   Intra-op Plan:   Post-operative Plan:   Informed Consent: I have reviewed the patients History and Physical, chart, labs and discussed the procedure including the risks, benefits and alternatives for the proposed anesthesia with the patient or authorized representative who has indicated his/her understanding and acceptance.     Plan Discussed with: CRNA  Anesthesia Plan Comments:         Anesthesia Quick Evaluation

## 2016-10-07 NOTE — Op Note (Signed)
[facility] Gastroenterology Patient Name: Alexandria Anderson Procedure Date: 10/07/2016 10:53 AM MRN: [id number] Account #: [id number] Date of Birth: 1966/05/11 Admit Type: Outpatient Age: 51 Room: [facility] OR ROOM 01 Gender: Female Note Status: Finalized Procedure:            Colonoscopy Indications:          Hematochezia Providers:            Lucilla Lame MD, MD Medicines:            Propofol per Anesthesia Complications:        No immediate complications. Procedure:            Pre-Anesthesia Assessment:                       - Prior to the procedure, a History and Physical was                        performed, and patient medications and allergies were                        reviewed. The patient's tolerance of previous                        anesthesia was also reviewed. The risks and benefits of                        the procedure and the sedation options and risks were                        discussed with the patient. All questions were                        answered, and informed consent was obtained. Prior                        Anticoagulants: The patient has taken no previous                        anticoagulant or antiplatelet agents. ASA Grade                        Assessment: II - A patient with mild systemic disease.                        After reviewing the risks and benefits, the patient was                        deemed in satisfactory condition to undergo the                        procedure.                       After obtaining informed consent, the colonoscope was                        passed under direct vision. Throughout the procedure,                        the patient's blood pressure, pulse, and oxygen  saturations were monitored continuously. The Olympus                        PCF H180AL Colonoscope (S#: [id number]) was introduced                        through the anus and advanced to the the terminal      ileum. The colonoscopy was performed without                        difficulty. The patient tolerated the procedure well.                        The quality of the bowel preparation was excellent. Findings:      The perianal and digital rectal examinations were normal.      Non-bleeding internal hemorrhoids were found during retroflexion. The       hemorrhoids were Grade II (internal hemorrhoids that prolapse but reduce       spontaneously).      The terminal ileum appeared normal. Impression:           - Non-bleeding internal hemorrhoids.                       - The examined portion of the ileum was normal.                       - No specimens collected. Recommendation:       - Discharge patient to home.                       - Resume previous diet.                       - Continue present medications. Procedure Code(s):    --- Professional ---                       [phone], Colonoscopy, flexible; diagnostic, including                        collection of specimen(s) by brushing or washing, when                        performed (separate procedure) Diagnosis Code(s):    --- Professional ---                       K92.1, Melena (includes Hematochezia) CPT copyright 2016 American Medical Association. All rights reserved. The codes documented in this report are preliminary and upon coder review may  be revised to meet current compliance requirements. Lucilla Lame MD, MD 10/07/2016 11:12:17 AM This report has been signed electronically. Number of Addenda: 0 Note Initiated On: 10/07/2016 10:53 AM Scope Withdrawal Time: 0 hours 5 minutes 36 seconds  Total Procedure Duration: 0 hours 8 minutes 13 seconds       [facility]

## 2016-10-07 NOTE — H&P (Signed)
Lucilla Lame, MD [address] [place], [place] [address] Phone: [phone] Fax : [phone]  Primary Care Physician:  No PCP Per Patient Primary Gastroenterologist:  Dr. Allen Norris  Pre-Procedure History & Physical: HPI:  Alexandria Anderson is a 51 y.o. female is here for an colonoscopy.   Past Medical History:  Diagnosis Date  . Anemia   . Asthma    as a child  . GERD (gastroesophageal reflux disease)   . PONV (postoperative nausea and vomiting)    after first foot surgery    Past Surgical History:  Procedure Laterality Date  . CYSTOSCOPY N/A 02/03/2015   Procedure: CYSTOSCOPY;  Surgeon: Will Bonnet, MD;  Location: [facility] ORS;  Service: Gynecology;  Laterality: N/A;  . HAMMER TOE SURGERY Bilateral   . LAPAROSCOPIC HYSTERECTOMY N/A 02/03/2015   Procedure: HYSTERECTOMY TOTAL LAPAROSCOPIC/BILATERAL SALPINGECTOMY;  Surgeon: Will Bonnet, MD;  Location: [facility] ORS;  Service: Gynecology;  Laterality: N/A;    Prior to Admission medications   Medication Sig Start Date End Date Taking? Authorizing Provider  baclofen (LIORESAL) 10 MG tablet TAKE 1 TABLET (10 MG TOTAL) BY MOUTH 3 (THREE) TIMES DAILY. FOR MUSCLE SPASMS 09/23/16  Yes Versie Starks, PA-C  [employer] ALLERGY RELIEF-D 5-120 MG tablet TAKE 1 TABLET BY MOUTH TWICE A DAY 10/06/16  Yes Versie Starks, PA-C  fluticasone [facility]) 50 MCG/ACT nasal spray Place 2 sprays into both nostrils daily. 07/31/15  Yes Versie Starks, PA-C  ibuprofen (ADVIL,MOTRIN) 600 MG tablet TAKE 1 TABLET BY MOUTH EVERY 6 HOURS AS NEEDED FOR MILD PAIN OR CRAMPING 12/16/15  Yes Versie Starks, PA-C  meloxicam (MOBIC) 15 MG tablet TAKE 1 TABLET (15 MG TOTAL) BY MOUTH DAILY. 09/23/16  Yes Versie Starks, PA-C  metroNIDAZOLE (FLAGYL) 500 MG tablet Take 1 tablet (500 mg total) by mouth 3 (three) times daily. 09/02/16  Yes Versie Starks, PA-C  omeprazole (PRILOSEC) 20 MG capsule TAKE ONE CAPSULE BY MOUTH EVERY DAY 08/30/16  Yes Versie Starks, PA-C  TURMERIC PO  Take by mouth daily.   Yes Historical Provider, MD  Probiotic Product (ALIGN) 4 MG CAPS Take 1 capsule (4 mg total) by mouth AC breakfast. Patient not taking: Reported on 10/07/2016 01/25/16   Versie Starks, PA-C  SUPREP BOWEL PREP KIT 17.5-3.13-1.6 GM/180ML SOLN See admin instructions. 09/27/16   Historical Provider, MD    Allergies as of 10/06/2016  . (No Known Allergies)    Family History  Problem Relation Age of Onset  . Hypertension Mother   . Hypercholesterolemia Mother     Social History   Social History  . Marital status: Married    Spouse name: N/A  . Number of children: N/A  . Years of education: N/A   Occupational History  . Not on file.   Social History Main Topics  . Smoking status: Never Smoker  . Smokeless tobacco: Never Used  . Alcohol use 1.2 oz/week    2 Glasses of wine per week     Comment:    . Drug use: No  . Sexual activity: Not on file   Other Topics Concern  . Not on file   Social History Narrative  . No narrative on file    Review of Systems: See HPI, otherwise negative ROS  Physical Exam: BP 116/77   Pulse 80   Temp 98.2 F (36.8 C) (Temporal)   Ht _0  (1.651 m)   Wt 147 lb (66.7 kg)  LMP 12/30/2014 (Approximate)   SpO2 99%   BMI 24.46 kg/m  General:   Alert,  pleasant and cooperative in NAD Head:  Normocephalic and atraumatic. Neck:  Supple; no masses or thyromegaly. Lungs:  Clear throughout to auscultation.    Heart:  Regular rate and rhythm. Abdomen:  Soft, nontender and nondistended. Normal bowel sounds, without guarding, and without rebound.   Neurologic:  Alert and  oriented x4;  grossly normal neurologically.  Impression/Plan: Alexandria Anderson is here for an colonoscopy to be performed for hematochezia  Risks, benefits, limitations, and alternatives regarding  colonoscopy have been reviewed with the patient.  Questions have been answered.  All parties agreeable.   Lucilla Lame, MD  10/07/2016, 10:17 AM

## 2016-10-10 ENCOUNTER — Encounter: Payer: Self-pay | Admitting: Gastroenterology

## 2016-11-03 ENCOUNTER — Other Ambulatory Visit: Payer: Self-pay | Admitting: Emergency Medicine

## 2016-11-03 MED ORDER — CETIRIZINE HCL 10 MG PO TABS: 10.0000 mg | ORAL_TABLET | Freq: Every day | ORAL | 11 refills | Status: DC

## 2016-11-09 ENCOUNTER — Ambulatory Visit: Payer: Self-pay | Admitting: Physician Assistant

## 2016-11-09 ENCOUNTER — Encounter: Payer: Self-pay | Admitting: Physician Assistant

## 2016-11-09 VITALS — BP 121/70 | HR 82 | Temp 98.3°F

## 2016-11-09 DIAGNOSIS — H1010 Acute atopic conjunctivitis, unspecified eye: Secondary | ICD-10-CM

## 2016-11-09 MED ORDER — OLOPATADINE HCL 0.1 % OP SOLN: 1.0000 [drp] | Freq: Two times a day (BID) | OPHTHALMIC | 12 refills | Status: DC

## 2016-11-09 NOTE — Progress Notes (Signed)
S:  C/o both eyes being irritated, crusted in the mornings then they are clear, but by the end of the day they start to get red, ?if allergies, sx for over a week, has hx of seasonal allergies, denies, cough, congestion, fever, chills, v/d; remainder ros neg  O: vitals wnl, nad, perrl eomi,  Conjunctiva appears normal b/l, no drainage or matting noted at this time, tms clear, nasal mucosa inflamed, throat wnl, neck supple no lymph, lungs c t a, cv rrr  A:  Acute allergic conjunctivitis  P: patanol opth gtts, return if not better in 3 - 5d, if worsening return earlier or see eye doctor, no work until eye is no longer red or draining

## 2016-11-30 ENCOUNTER — Other Ambulatory Visit: Payer: Self-pay | Admitting: Physician Assistant

## 2016-11-30 DIAGNOSIS — J018 Other acute sinusitis: Secondary | ICD-10-CM

## 2016-11-30 NOTE — Telephone Encounter (Signed)
Med refill for flonase approved

## 2016-12-09 ENCOUNTER — Other Ambulatory Visit: Payer: Self-pay | Admitting: Physician Assistant

## 2016-12-12 ENCOUNTER — Ambulatory Visit: Payer: Self-pay | Admitting: Obstetrics and Gynecology

## 2016-12-12 NOTE — Telephone Encounter (Signed)
Med refill approved 

## 2016-12-16 ENCOUNTER — Encounter: Payer: Self-pay | Admitting: Physician Assistant

## 2016-12-16 ENCOUNTER — Ambulatory Visit: Payer: Self-pay | Admitting: Physician Assistant

## 2016-12-16 VITALS — BP 100/70 | HR 83 | Temp 98.5°F | Ht 65.0 in | Wt 145.0 lb

## 2016-12-16 DIAGNOSIS — Z0000 Encounter for general adult medical examination without abnormal findings: Secondary | ICD-10-CM

## 2016-12-16 DIAGNOSIS — Z008 Encounter for other general examination: Secondary | ICD-10-CM

## 2016-12-16 DIAGNOSIS — Z0189 Encounter for other specified special examinations: Secondary | ICD-10-CM

## 2016-12-16 NOTE — Progress Notes (Signed)
S: pt here for wellness physical and biometrics for insurance purposes, no complaints ros neg. Needs order for mammogram, had colonoscopy recently good for 10 years  PMH:   Allergies, low vit d, gerd  Social: no tobacco use, etoh about 3 glasses wine q week, no drugs  Fam: as noted on chart  O: vitals wnl, nad, ENT wnl, neck supple no lymph, lungs c t a, cv rrr, abd soft nontender bs normal all 4 quads  A: wellness, biometric physical  P: lab for vit d

## 2016-12-17 LAB — VITAMIN D 25 HYDROXY (VIT D DEFICIENCY, FRACTURES): VIT D 25 HYDROXY: 17.2 ng/mL — AB (ref 30.0–100.0)

## 2017-01-04 ENCOUNTER — Other Ambulatory Visit: Payer: Self-pay | Admitting: Physician Assistant

## 2017-01-04 DIAGNOSIS — M542 Cervicalgia: Secondary | ICD-10-CM

## 2017-01-04 NOTE — Telephone Encounter (Signed)
Med refill approved 

## 2017-01-12 ENCOUNTER — Ambulatory Visit
Admission: RE | Admit: 2017-01-12 | Discharge: 2017-01-12 | Disposition: A | Discharge status: Home / Self Care | Payer: Managed Care, Other (non HMO) | Source: Ambulatory Visit | Attending: Physician Assistant | Admitting: Physician Assistant

## 2017-01-12 DIAGNOSIS — Z1231 Encounter for screening mammogram for malignant neoplasm of breast: Secondary | ICD-10-CM | POA: Insufficient documentation

## 2017-01-12 DIAGNOSIS — Z0000 Encounter for general adult medical examination without abnormal findings: Secondary | ICD-10-CM

## 2017-01-13 ENCOUNTER — Other Ambulatory Visit: Payer: Self-pay | Admitting: Physician Assistant

## 2017-01-13 DIAGNOSIS — R921 Mammographic calcification found on diagnostic imaging of breast: Secondary | ICD-10-CM

## 2017-01-13 DIAGNOSIS — R928 Other abnormal and inconclusive findings on diagnostic imaging of breast: Secondary | ICD-10-CM

## 2017-01-17 ENCOUNTER — Other Ambulatory Visit: Payer: Self-pay | Admitting: Physician Assistant

## 2017-01-17 ENCOUNTER — Ambulatory Visit
Admission: RE | Admit: 2017-01-17 | Discharge: 2017-01-17 | Disposition: A | Discharge status: Home / Self Care | Payer: Managed Care, Other (non HMO) | Source: Ambulatory Visit | Attending: Physician Assistant | Admitting: Physician Assistant

## 2017-01-17 DIAGNOSIS — R921 Mammographic calcification found on diagnostic imaging of breast: Secondary | ICD-10-CM | POA: Diagnosis present

## 2017-01-17 DIAGNOSIS — R928 Other abnormal and inconclusive findings on diagnostic imaging of breast: Secondary | ICD-10-CM

## 2017-01-27 ENCOUNTER — Ambulatory Visit: Payer: Self-pay | Admitting: Physician Assistant

## 2017-01-27 ENCOUNTER — Encounter: Payer: Self-pay | Admitting: Physician Assistant

## 2017-01-27 VITALS — BP 100/70 | HR 73 | Temp 98.5°F | Resp 15

## 2017-01-27 DIAGNOSIS — H1033 Unspecified acute conjunctivitis, bilateral: Secondary | ICD-10-CM

## 2017-01-27 MED ORDER — TOBRAMYCIN 0.3 % OP SOLN: 2.0000 [drp] | OPHTHALMIC | 0 refills | Status: DC

## 2017-01-27 NOTE — Progress Notes (Signed)
S:  C/o both eyes being irritated, matted in the mornings, sx for a month, is using maybelline mascara, has hx of seasonal allergies but this is different, denies, cough, congestion, fever, chills, v/d; remainder ros neg  O: vitals wnl, nad, perrl eomi, r eye with injected conjunctiva, no drainage or matting noted at this time, tms clear, nasal mucosa inflamed, throat wnl, neck supple no lymph, lungs c t a, cv rrr  A:  Acute conjunctivitis  P: tobramycin opth gtts, return if not better in 3 - 5d, if worsening return earlier or see eye doctor, no work until eye is no longer red or draining, discard the maybelline, try almay hypoallergenic mascara

## 2017-04-07 ENCOUNTER — Other Ambulatory Visit: Payer: Self-pay | Admitting: Physician Assistant

## 2017-04-07 DIAGNOSIS — M542 Cervicalgia: Secondary | ICD-10-CM

## 2017-04-07 NOTE — Telephone Encounter (Signed)
Med refill approved 

## 2017-06-12 ENCOUNTER — Other Ambulatory Visit: Payer: Self-pay | Admitting: Physician Assistant

## 2017-06-20 ENCOUNTER — Ambulatory Visit: Payer: Self-pay | Admitting: Physician Assistant

## 2017-06-20 ENCOUNTER — Encounter: Payer: Self-pay | Admitting: Physician Assistant

## 2017-06-20 VITALS — BP 100/60 | HR 81 | Temp 98.5°F | Resp 16

## 2017-06-20 DIAGNOSIS — H04123 Dry eye syndrome of bilateral lacrimal glands: Secondary | ICD-10-CM

## 2017-06-20 MED ORDER — SULFACETAMIDE SODIUM 10 % OP SOLN: 1.0000 [drp] | OPHTHALMIC | 0 refills | Status: DC

## 2017-06-20 MED ORDER — PREDNISOLONE ACETATE 0.12 % OP SUSP: 1.0000 [drp] | Freq: Four times a day (QID) | OPHTHALMIC | 0 refills | Status: DC

## 2017-06-20 NOTE — Progress Notes (Signed)
S: Complains of her eyes feeling like they have a film over them, has tried her prescription allergy eye drops, Tobrex, and is taken allergy meds without relief, states she had the same thing happen last year and ended up on prednisone drops  O: Vitals are normal, pupils are equal round and reactive to light, conjunctiva appears normal, neck is supple, no lymphadenopathy   A: dry eyes  P: bleph 10 opth gtts (with prednisolone)

## 2017-07-12 ENCOUNTER — Ambulatory Visit: Payer: Self-pay | Admitting: Physician Assistant

## 2017-07-12 DIAGNOSIS — M542 Cervicalgia: Secondary | ICD-10-CM

## 2017-07-12 MED ORDER — BACLOFEN 10 MG PO TABS: 10.0000 mg | ORAL_TABLET | Freq: Three times a day (TID) | ORAL | 3 refills | Status: DC

## 2017-07-12 MED ORDER — IBUPROFEN 600 MG PO TABS: ORAL_TABLET | ORAL | 5 refills | Status: DC

## 2017-07-12 NOTE — Progress Notes (Signed)
   Subjective: Medication refill     Patient ID: Alexandria Anderson, female    DOB: [birth year], 51 y.o.   MRN: [id number]  HPI Patient presents today for medication refill for baclofen and ibuprofen for her muscle cramping.   Review of Systems  Negative except for complaint    Objective:   Physical Exam  Deferred      Assessment & Plan: Medication refill   Patient prescription for baclofen and ibuprofen as refill. Patient passed follow-up as necessary.

## 2017-08-16 ENCOUNTER — Ambulatory Visit (INDEPENDENT_AMBULATORY_CARE_PROVIDER_SITE_OTHER): Payer: Managed Care, Other (non HMO) | Admitting: Obstetrics and Gynecology

## 2017-08-16 ENCOUNTER — Encounter: Payer: Self-pay | Admitting: Obstetrics and Gynecology

## 2017-08-16 VITALS — BP 122/70 | Ht 65.0 in | Wt 151.0 lb

## 2017-08-16 DIAGNOSIS — N951 Menopausal and female climacteric states: Secondary | ICD-10-CM | POA: Diagnosis not present

## 2017-08-16 MED ORDER — ESTRADIOL 0.05 MG/24HR TD PTTW: 1.0000 | MEDICATED_PATCH | TRANSDERMAL | 2 refills | Status: DC

## 2017-08-16 NOTE — Progress Notes (Signed)
Gynecology Annual Exam  PCP: Patient, No Pcp Per  Chief Complaint  Patient presents with  . Hot flashes    History of Present Illness: 52 y.o. [id number] female who presents with worsening hot flushes over the past several months. She is having difficulty sleeping. She has daytime symptoms, as well.  She has had an increase in yeast infections.  She has tried [employer], but neither have helped her with her symptoms. She would like something to help improve her symptoms. She denies history of hypertension, liver issues, DVTs, smoking.  Past Medical History:  Diagnosis Date  . Allergy   . Anemia   . Asthma    as a child  . GERD (gastroesophageal reflux disease)   . PONV (postoperative nausea and vomiting)    after first foot surgery  . Vitamin D deficiency     Past Surgical History:  Procedure Laterality Date  . COLONOSCOPY WITH PROPOFOL N/A 10/07/2016   Procedure: COLONOSCOPY WITH PROPOFOL;  Surgeon: Lucilla Lame, MD;  Location: [place];  Service: Endoscopy;  Laterality: N/A;  . CYSTOSCOPY N/A 02/03/2015   Procedure: CYSTOSCOPY;  Surgeon: Will Bonnet, MD;  Location: [facility];  Service: Gynecology;  Laterality: N/A;  . HAMMER TOE SURGERY Bilateral   . LAPAROSCOPIC HYSTERECTOMY N/A 02/03/2015   Procedure: HYSTERECTOMY TOTAL LAPAROSCOPIC/BILATERAL SALPINGECTOMY;  Surgeon: Will Bonnet, MD;  Location: [facility];  Service: Gynecology;  Laterality: N/A;    Prior to Admission medications   Medication Sig Start Date End Date Taking? Authorizing Provider  baclofen (LIORESAL) 10 MG tablet Take 1 tablet (10 mg total) by mouth 3 (three) times daily. For muscle spasms 07/12/17  Yes Sable Feil, PA-C  cetirizine (ZYRTEC) 10 MG tablet Take 1 tablet (10 mg total) by mouth daily. 11/03/16  Yes Fisher, Linden Dolin, PA-C  fluticasone (FLONASE) 50 MCG/ACT nasal spray PLACE 2 SPRAYS INTO BOTH NOSTRILS DAILY. 11/30/16  Yes Fisher, Linden Dolin, PA-C  ibuprofen (ADVIL,MOTRIN) 600 MG  tablet TAKE 1 TABLET BY MOUTH EVERY 6 HOURS AS NEEDED FOR MILD PAIN OR CRAMPING 07/12/17  Yes Sable Feil, PA-C  olopatadine (PATANOL) 0.1 % ophthalmic solution Place 1 drop into both eyes 2 (two) times daily. 11/09/16  Yes Fisher, Linden Dolin, PA-C  omeprazole (PRILOSEC) 20 MG capsule Take 20 mg by mouth daily. 11/09/16  Yes [provider]  prednisoLONE acetate (PRED MILD) 0.12 % ophthalmic suspension Place 1 drop into both eyes 4 (four) times daily. 06/20/17  Yes Fisher, Linden Dolin, PA-C  sulfacetamide (BLEPH-10) 10 % ophthalmic solution Place 1 drop into both eyes every 3 (three) hours. 06/20/17  Yes Versie Starks, PA-C  terconazole (TERAZOL 7) 0.4 % vaginal cream PLACE 1 APPLICATORFUL VAGINALLY AT BEDTIME 06/13/17  Yes Fisher, Linden Dolin, PA-C  tobramycin (TOBREX) 0.3 % ophthalmic solution Place 2 drops into both eyes every 4 (four) hours. 01/27/17  Yes Versie Starks, PA-C   Allergies: No Known Allergies   Obstetric History: [id number]  Family History  Problem Relation Age of Onset  . Hypertension Mother   . Hypercholesterolemia Mother   . Hyperlipidemia Mother   . Heart disease Mother   . Alcohol abuse Father   . Cirrhosis Father   . Breast cancer Neg Hx     Social History   Socioeconomic History  . Marital status: Married    Spouse name: Not on file  . Number of children: Not on file  . Years of education: Not on file  .  Highest education level: Not on file  Social Needs  . Financial resource strain: Not on file  . Food insecurity - worry: Not on file  . Food insecurity - inability: Not on file  . Transportation needs - medical: Not on file  . Transportation needs - non-medical: Not on file  Occupational History  . Not on file  Tobacco Use  . Smoking status: Never Smoker  . Smokeless tobacco: Never Used  Substance and Sexual Activity  . Alcohol use: Yes    Alcohol/week: 1.2 oz    Types: 2 Glasses of wine per week    Comment:    . Drug use: No  . Sexual  activity: Yes    Birth control/protection: Post-menopausal, Surgical  Other Topics Concern  . Not on file  Social History Narrative  . Not on file    Review of Systems  Constitutional: Negative.        + for hot flushes  HENT: Negative.   Eyes: Negative.   Respiratory: Negative.   Cardiovascular: Negative.   Gastrointestinal: Negative.   Genitourinary: Negative.   Musculoskeletal: Negative.   Skin: Negative.   Neurological: Negative.   Psychiatric/Behavioral: Negative.      Physical Exam BP 122/70   Ht 5\' 5"  (1.651 m)   Wt 151 lb (68.5 kg)   LMP 12/30/2014 (Approximate)   BMI 25.13 kg/m   Physical Exam  Constitutional: She is oriented to person, place, and time. She appears well-developed and well-nourished. No distress.  Eyes: EOM are normal. No scleral icterus.  Neck: Normal range of motion. Neck supple.  Cardiovascular: Normal rate and regular rhythm.  Pulmonary/Chest: Effort normal and breath sounds normal. No respiratory distress. She has no wheezes. She has no rales.  Abdominal: Soft. Bowel sounds are normal. She exhibits no distension and no mass. There is no tenderness. There is no rebound and no guarding.  Musculoskeletal: Normal range of motion. She exhibits no edema.  Neurological: She is alert and oriented to person, place, and time. No cranial nerve deficit.  Skin: Skin is warm and dry. No erythema.  Psychiatric: She has a normal mood and affect. Her behavior is normal. Judgment normal.   Assessment: 52 y.o. [id number] here for  1. Climacteric     Plan: Problem List Items Addressed This Visit      Other   Climacteric - Primary   Relevant Medications   estradiol (ALORA) 0.05 MG/24HR patch (Start on 08/17/2017)     We discussed WHI study findings in detail.  In the combined estrogen-progesterone arm breast cancer risk was increased by 1.26 (CI of 1.00 to 1.59), coronary heart disease 1.29 (CI 1.02-1.63), stroke risk 1.41 (1.07-1.85), and pulmonary embolism  2.13 (CI 1.39-3.25).  That being said the while statistically significant the actual number of cases attributable are relatively small at an addition 8 cases of breast cancer, 7 more coronary artery event, 8 more strokes, and 8 additional case of pulmonary embolism per 10,000 women.  Study was terminated because of the increased breast cancer risk, this was not seen in the progestin only arm of the study for women without an intact uterus.  In addition it is important to note that HRT also had positive or risk reducing effects, and all cause mortality between the HRT/non-HRT users is not statistically different.  Estrogen-progestin HRT decreased the relative risk of hip fracture 0.66 (CI 0.45-0.98), colorectal cancer 0.63 (0.43-0.92).  Current consensus is to limit dose to the lowest effective dose, and shortest treatment  duration possible.  Breast cancer risk appeared to increase after 4 years of use.  Also important to note is that these risk refer to systemic HRT for the treatment of vasomotor symptoms, and do not apply to vaginal preperations with minimal systemic absorption and aimed at treating symptoms of vulvovaginal atrophy.    We briefly touched on findings of WHIMS trial published in 2005 which looked at women 61 year of age or older, and whether HRT was protective against the development of dementia.  The study revealed that HRT actually increased the risk for the development of dementia but was limited by looking only at patients 31 years of age and older.  The subsequent KEEPS trial  In 2012 which looked at HRT in recently postmenopausal women did not show any improvement in cognitive function for women on HRT.  However, there was also no significant cognitive declines seen in recently postmenopausal women receiving HRT as had previously been shown in the WHIMS trial.    Given her lack of a uterus, she will only require estrogen. We discussed the temporary nature of this treatment. The goals of  treatment are not necessarily to eradicate hot flushes, but to make them less frequent, severe (I.e. Tolerable).  She understands risks of DVT with estrogen products. Discussed warning signs of swelling that is uneven in one leg, pain in one leg, chest pain, trouble breathing, headache, stroke-like symptoms.  Will follow up in 2 months to assess response.  25 minutes spent in face to face discussion with > 50% spent in counseling, management, and coordination of care of her climacteric symptoms of hot flushes.   Prentice Docker, MD 08/16/2017 6:04 PM

## 2017-09-07 ENCOUNTER — Telehealth: Payer: Self-pay

## 2017-09-07 NOTE — Telephone Encounter (Signed)
Pt stating it is time for her to refill the Estradiol SDJ has put her on. It is a low dose and she wants him to know she is not having any relief. Before she refills medication does he have any advise. CB# [phone]

## 2017-09-08 NOTE — Telephone Encounter (Signed)
Pt calling back again today about this. She would like to hear back from [facility] today

## 2017-09-08 NOTE — Telephone Encounter (Signed)
Her number of hot flashes has decreased, but severity is still the same. She has completed her first pack and wanted to verify whether I thought she should increase the dose or give it more time. I encouraged her to give this dose a full 2 months prior to going up on the dose.  She voiced that she thought this would be reasonable. She will check back in with me a month or so.

## 2017-10-30 ENCOUNTER — Ambulatory Visit: Payer: Self-pay | Admitting: Family Medicine

## 2017-10-30 VITALS — BP 131/76 | HR 88 | Resp 20

## 2017-10-30 DIAGNOSIS — Z79899 Other long term (current) drug therapy: Secondary | ICD-10-CM

## 2017-10-30 DIAGNOSIS — M542 Cervicalgia: Secondary | ICD-10-CM

## 2017-10-30 DIAGNOSIS — J309 Allergic rhinitis, unspecified: Secondary | ICD-10-CM

## 2017-10-30 DIAGNOSIS — J018 Other acute sinusitis: Secondary | ICD-10-CM
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
## 2017-10-31 LAB — BASIC METABOLIC PANEL
BUN/Creatinine Ratio: 15 (ref 9–23)
BUN: 10 mg/dL (ref 6–24)
CALCIUM: 9.1 mg/dL (ref 8.7–10.2)
CO2: 20 mmol/L (ref 20–29)
Chloride: 102 mmol/L (ref 96–106)
Creatinine, Ser: 0.65 mg/dL (ref 0.57–1.00)
GFR calc Af Amer: 121 mL/min/{1.73_m2} (ref >59)
GFR, EST NON AFRICAN AMERICAN: 105 mL/min/{1.73_m2} (ref >59)
Glucose: 80 mg/dL (ref 65–99)
Potassium: 4.4 mmol/L (ref 3.5–5.2)
SODIUM: 140 mmol/L (ref 134–144)

## 2017-10-31 MED ORDER — FLUTICASONE PROPIONATE 50 MCG/ACT NA SUSP: NASAL | 0 refills | Status: DC

## 2017-10-31 MED ORDER — IBUPROFEN 600 MG PO TABS: ORAL_TABLET | ORAL | 0 refills | Status: DC

## 2017-10-31 MED ORDER — BACLOFEN 10 MG PO TABS: 10.0000 mg | ORAL_TABLET | Freq: Three times a day (TID) | ORAL | 0 refills | Status: DC | PRN

## 2017-10-31 NOTE — Addendum Note (Signed)
Addended by: Tyrone Nine on: 10/31/2017 04:21 PM   Modules accepted: Orders

## 2017-10-31 NOTE — Progress Notes (Signed)
Alexandria Anderson, Will you call this patient and inform her that her blood work that we did yesterday is normal and that I will be sending in her refills as discussed?

## 2017-11-01 ENCOUNTER — Other Ambulatory Visit: Payer: Self-pay | Admitting: Obstetrics and Gynecology

## 2017-11-01 DIAGNOSIS — N951 Menopausal and female climacteric states: Secondary | ICD-10-CM

## 2017-11-01 NOTE — Telephone Encounter (Signed)
Pt state SDJ has her on an estrogen patch. She is out of rf's & her pharamcy request was denied. Pt inquiring why it was denied. Cb#[id number].

## 2017-11-01 NOTE — Telephone Encounter (Signed)
Pt scheduled for 11/06/17 at 3:10

## 2017-11-01 NOTE — Telephone Encounter (Signed)
Pt needs a follow up appt for this medication. Please get this appt scheduled for pt. She is aware someone will be calling her back to schedule. No double book necessary.

## 2017-11-01 NOTE — Telephone Encounter (Signed)
Do you want to send in a refill to last to appt or wait until her appt next week for follow up?

## 2017-11-02 ENCOUNTER — Telehealth: Payer: Self-pay

## 2017-11-02 DIAGNOSIS — N951 Menopausal and female climacteric states: Secondary | ICD-10-CM

## 2017-11-02 MED ORDER — ESTRADIOL 0.05 MG/24HR TD PTTW: 1.0000 | MEDICATED_PATCH | TRANSDERMAL | 10 refills | Status: DC

## 2017-11-02 NOTE — Telephone Encounter (Signed)
Patient notified

## 2017-11-02 NOTE — Telephone Encounter (Signed)
You can call in a refill to get her until her next annual is due, if she is getting a satisfactory response. I do not need another appointment with her, unless she wants to discuss something. Would you mind calling this in and letting her know? I think she would need a 10 month supply. Thanks, Prentice Docker, MD

## 2017-11-02 NOTE — Telephone Encounter (Signed)
Refill sent in , sara can you call pt and let her know she does not need the follow up appt please?

## 2017-11-06 ENCOUNTER — Ambulatory Visit: Payer: Managed Care, Other (non HMO) | Admitting: Obstetrics and Gynecology

## 2017-11-14 ENCOUNTER — Encounter: Payer: Self-pay | Admitting: Obstetrics and Gynecology

## 2017-11-14 ENCOUNTER — Ambulatory Visit: Payer: Managed Care, Other (non HMO) | Admitting: Obstetrics and Gynecology

## 2017-11-14 VITALS — BP 118/66 | HR 93 | Ht 65.0 in | Wt 151.0 lb

## 2017-11-14 DIAGNOSIS — N761 Subacute and chronic vaginitis: Secondary | ICD-10-CM | POA: Diagnosis not present

## 2017-11-14 DIAGNOSIS — B373 Candidiasis of vulva and vagina: Secondary | ICD-10-CM | POA: Diagnosis not present

## 2017-11-14 DIAGNOSIS — B3731 Acute candidiasis of vulva and vagina: Secondary | ICD-10-CM

## 2017-11-14 NOTE — Progress Notes (Signed)
Patient ID: Alexandria Anderson, female   DOB: 24-Jan-1966, 52 y.o.   MRN: [id number]  Reason for Consult: Vaginitis (possible BV)   Referred by No ref. provider found  Subjective:     HPI:  Alexandria Anderson is a 52 y.o. female patient presents today for complaints of vaginal irritation.  Patient reports that she has been having recurring yeast infections.  Patient has been treated for these infections by her primary care physician.  Patient reports that her primary care physician does not perform vaginal exam since she has not been sure if these infections have been yeast infections.  Patient describes the discharge as white she denies any thickness or watery quality to the discharge.  Patient does report a slight odor to the discharge.  She denies any itching or irritation.  The patient is sexually active and has not been having any dyspareunia patient denies any pain with urination.  Patient estimates that she is having approximately 6 yeast infections a year.  Patient has been taking Terazol for these but has not achieved relief.  She reports that in the past she has used Diflucan but this medication is generally not successful for her.  Past Medical History:  Diagnosis Date  . Allergy   . Anemia   . Asthma    as a child  . GERD (gastroesophageal reflux disease)   . PONV (postoperative nausea and vomiting)    after first foot surgery  . Vitamin D deficiency    Family History  Problem Relation Age of Onset  . Hypertension Mother   . Hypercholesterolemia Mother   . Hyperlipidemia Mother   . Heart disease Mother   . Alcohol abuse Father   . Cirrhosis Father   . Breast cancer Neg Hx    Past Surgical History:  Procedure Laterality Date  . COLONOSCOPY WITH PROPOFOL N/A 10/07/2016   Procedure: COLONOSCOPY WITH PROPOFOL;  Surgeon: Lucilla Lame, MD;  Location: [place];  Service: Endoscopy;  Laterality: N/A;  . CYSTOSCOPY N/A 02/03/2015   Procedure: CYSTOSCOPY;  Surgeon: Will Bonnet, MD;  Location: [facility];  Service: Gynecology;  Laterality: N/A;  . HAMMER TOE SURGERY Bilateral   . LAPAROSCOPIC HYSTERECTOMY N/A 02/03/2015   Procedure: HYSTERECTOMY TOTAL LAPAROSCOPIC/BILATERAL SALPINGECTOMY;  Surgeon: Will Bonnet, MD;  Location: [facility];  Service: Gynecology;  Laterality: N/A;    Short Social History:  Social History   Tobacco Use  . Smoking status: Never Smoker  . Smokeless tobacco: Never Used  Substance Use Topics  . Alcohol use: Yes    Alcohol/week: 1.2 oz    Types: 2 Glasses of wine per week    Comment:      No Known Allergies  Current Outpatient Medications  Medication Sig Dispense Refill  . baclofen (LIORESAL) 10 MG tablet Take 1 tablet (10 mg total) by mouth 3 (three) times daily as needed for muscle spasms. For muscle spasms 90 tablet 0  . cetirizine (ZYRTEC) 10 MG tablet Take 1 tablet (10 mg total) by mouth daily. 30 tablet 11  . estradiol (ALORA) 0.05 MG/24HR patch Place 1 patch (0.05 mg total) onto the skin 2 (two) times a week. 8 patch 10  . fluticasone (FLONASE) 50 MCG/ACT nasal spray 1-2 sprays into each nostril daily 16 g 0  . ibuprofen (ADVIL,MOTRIN) 600 MG tablet TAKE 1 TABLET BY MOUTH EVERY 6 HOURS AS NEEDED FOR MILD PAIN OR CRAMPING 30 tablet 0  . olopatadine (PATANOL) 0.1 % ophthalmic solution Place  1 drop into both eyes 2 (two) times daily. 5 mL 12  . omeprazole (PRILOSEC) 20 MG capsule Take 20 mg by mouth daily.  12   No current facility-administered medications for this visit.     Review of Systems  Constitutional: Negative for chills, fatigue, fever and unexpected weight change.  HENT: Negative for trouble swallowing.  Eyes: Negative for loss of vision.  Respiratory: Negative for cough, shortness of breath and wheezing.  Cardiovascular: Negative for chest pain, leg swelling, palpitations and syncope.  GI: Negative for abdominal pain, blood in stool, diarrhea, nausea and vomiting.  GU: Negative for difficulty  urinating, dysuria, frequency and hematuria.  Musculoskeletal: Negative for back pain, leg pain and joint pain.  Skin: Negative for rash.  Neurological: Negative for dizziness, headaches, light-headedness, numbness and seizures.  Psychiatric: Negative for behavioral problem, confusion, depressed mood and sleep disturbance.        Objective:  Objective   Vitals:   11/14/17 1607  BP: 118/66  Pulse: 93  Weight: 151 lb (68.5 kg)  Height: 5\' 5"  (1.651 m)   Body mass index is 25.13 kg/m.  Physical Exam  Constitutional: She is oriented to person, place, and time. She appears well-developed and well-nourished.  HENT:  Head: Normocephalic and atraumatic.  Eyes: EOM are normal.  Cardiovascular: Normal rate and regular rhythm.  Pulmonary/Chest: Effort normal. No respiratory distress.  Abdominal: Soft. She exhibits no distension and no mass. There is no tenderness.  Genitourinary:  Genitourinary Comments: Vaginal cuff intact.  Thick white discharge seen in vaginal vault.  And clinging to the vaginal walls.  Sample taken for wet mount and one swab.  Neurological: She is alert and oriented to person, place, and time.  Skin: Skin is warm and dry.  Psychiatric: She has a normal mood and affect. Her behavior is normal. Judgment and thought content normal.  Nursing note and vitals reviewed.   Wetmount: Microscopic wet-mount exam shows KOH done, hyphae, white blood cells.       Assessment/Plan:    52 year old with recurrent yeast infections Will send oneswab for yeast resistance Patient has 150mg  Diflucan at home, she will take 1 tab today and then again 3 days from now. Will have her return in 2 weeks for follow up. Patient may need long-term treatment for yeast recurrences. Patient reports that she has been screened with diabetes within the last year and the screen has been negative.   Homero Fellers MD [facility] 11/14/17 6:19 PM

## 2017-12-04 ENCOUNTER — Ambulatory Visit: Payer: Managed Care, Other (non HMO) | Admitting: Obstetrics and Gynecology

## 2017-12-04 VITALS — BP 122/84 | Ht 65.0 in | Wt 158.0 lb

## 2017-12-04 DIAGNOSIS — N761 Subacute and chronic vaginitis: Secondary | ICD-10-CM | POA: Diagnosis not present

## 2017-12-04 MED ORDER — CLINDAMYCIN HCL 300 MG PO CAPS: 300.0000 mg | ORAL_CAPSULE | Freq: Two times a day (BID) | ORAL | 0 refills | Status: AC

## 2017-12-04 MED ORDER — FLUCONAZOLE 150 MG PO TABS: 150.0000 mg | ORAL_TABLET | ORAL | 1 refills | Status: AC

## 2017-12-04 NOTE — Progress Notes (Signed)
Patient ID: Alexandria Anderson, female   DOB: 03-28-1966, 52 y.o.   MRN: [id number]  Reason for Consult: Follow-up   Referred by No ref. provider found  Subjective:     HPI:  Alexandria Anderson is a 52 y.o. female . She is following up today for recurrent yeast infections. She has recently completed treatment with diflucan. She reports persistent odor after intercourse. She does not use lubricants or condoms. MDL testing shows gardnerella, lactobacillus, and candida albicans in the vagina. Candida is sensitive to fluconazole.   Past Medical History:  Diagnosis Date  . Allergy   . Anemia   . Asthma    as a child  . GERD (gastroesophageal reflux disease)   . PONV (postoperative nausea and vomiting)    after first foot surgery  . Vitamin D deficiency    Family History  Problem Relation Age of Onset  . Hypertension Mother   . Hypercholesterolemia Mother   . Hyperlipidemia Mother   . Heart disease Mother   . Alcohol abuse Father   . Cirrhosis Father   . Breast cancer Neg Hx    Past Surgical History:  Procedure Laterality Date  . COLONOSCOPY WITH PROPOFOL N/A 10/07/2016   Procedure: COLONOSCOPY WITH PROPOFOL;  Surgeon: Lucilla Lame, MD;  Location: [place];  Service: Endoscopy;  Laterality: N/A;  . CYSTOSCOPY N/A 02/03/2015   Procedure: CYSTOSCOPY;  Surgeon: Will Bonnet, MD;  Location: [facility];  Service: Gynecology;  Laterality: N/A;  . HAMMER TOE SURGERY Bilateral   . LAPAROSCOPIC HYSTERECTOMY N/A 02/03/2015   Procedure: HYSTERECTOMY TOTAL LAPAROSCOPIC/BILATERAL SALPINGECTOMY;  Surgeon: Will Bonnet, MD;  Location: [facility];  Service: Gynecology;  Laterality: N/A;    Short Social History:  Social History   Tobacco Use  . Smoking status: Never Smoker  . Smokeless tobacco: Never Used  Substance Use Topics  . Alcohol use: Yes    Alcohol/week: 1.2 oz    Types: 2 Glasses of wine per week    Comment:      No Known Allergies  Current Outpatient Medications    Medication Sig Dispense Refill  . baclofen (LIORESAL) 10 MG tablet Take 1 tablet (10 mg total) by mouth 3 (three) times daily as needed for muscle spasms. For muscle spasms 90 tablet 0  . cetirizine (ZYRTEC) 10 MG tablet Take 1 tablet (10 mg total) by mouth daily. 30 tablet 11  . clindamycin (CLEOCIN) 300 MG capsule Take 1 capsule (300 mg total) by mouth 2 (two) times daily for 7 days. 14 capsule 0  . estradiol (ALORA) 0.05 MG/24HR patch Place 1 patch (0.05 mg total) onto the skin 2 (two) times a week. 8 patch 10  . fluconazole (DIFLUCAN) 150 MG tablet Take 1 tablet (150 mg total) by mouth once a week for 30 doses. 30 tablet 1  . fluticasone (FLONASE) 50 MCG/ACT nasal spray 1-2 sprays into each nostril daily 16 g 0  . ibuprofen (ADVIL,MOTRIN) 600 MG tablet TAKE 1 TABLET BY MOUTH EVERY 6 HOURS AS NEEDED FOR MILD PAIN OR CRAMPING 30 tablet 0  . olopatadine (PATANOL) 0.1 % ophthalmic solution Place 1 drop into both eyes 2 (two) times daily. 5 mL 12  . omeprazole (PRILOSEC) 20 MG capsule Take 20 mg by mouth daily.  12   No current facility-administered medications for this visit.     Review of Systems  Constitutional: Negative for chills, fatigue, fever and unexpected weight change.  HENT: Negative for trouble swallowing.  Eyes:  Negative for loss of vision.  Respiratory: Negative for cough, shortness of breath and wheezing.  Cardiovascular: Negative for chest pain, leg swelling, palpitations and syncope.  GI: Negative for abdominal pain, blood in stool, diarrhea, nausea and vomiting.  GU: Negative for difficulty urinating, dysuria, frequency and hematuria.  Musculoskeletal: Negative for back pain, leg pain and joint pain.  Skin: Negative for rash.  Neurological: Negative for dizziness, headaches, light-headedness, numbness and seizures.  Psychiatric: Negative for behavioral problem, confusion, depressed mood and sleep disturbance.        Objective:  Objective   Vitals:   12/04/17 1441   BP: 122/84  Weight: 158 lb (71.7 kg)  Height: 5\' 5"  (1.651 m)   Body mass index is 26.29 kg/m.  Physical Exam  Constitutional: She is oriented to person, place, and time. She appears well-developed and well-nourished.  HENT:  Head: Normocephalic and atraumatic.  Eyes: EOM are normal.  Cardiovascular: Normal rate and regular rhythm.  Pulmonary/Chest: Effort normal and breath sounds normal.  Neurological: She is alert and oriented to person, place, and time.  Skin: Skin is warm and dry.  Psychiatric: She has a normal mood and affect. Her behavior is normal. Judgment and thought content normal.  Nursing note and vitals reviewed.        Assessment/Plan:     52 yo with recurrent yeast vaginitis.  Will treat recurrent yeast vaginitis with weekly diflucan for 6 months.  Will treat gardnerella with one week of clindamycin BID.  Follow up as needed.   Annaleigha Prows MD [facility] 12/05/17 9:52 AM

## 2017-12-05 ENCOUNTER — Encounter: Payer: Self-pay | Admitting: Obstetrics and Gynecology

## 2018-01-02 ENCOUNTER — Other Ambulatory Visit: Payer: Self-pay | Admitting: Internal Medicine

## 2018-01-02 DIAGNOSIS — E049 Nontoxic goiter, unspecified: Secondary | ICD-10-CM

## 2018-01-16 ENCOUNTER — Ambulatory Visit: Payer: Self-pay | Admitting: Family Medicine

## 2018-01-16 ENCOUNTER — Ambulatory Visit: Payer: Managed Care, Other (non HMO)

## 2018-01-16 VITALS — BP 115/71 | HR 82 | Resp 20 | Ht 65.0 in | Wt 150.0 lb

## 2018-01-16 DIAGNOSIS — K219 Gastro-esophageal reflux disease without esophagitis: Secondary | ICD-10-CM

## 2018-01-16 DIAGNOSIS — Z008 Encounter for other general examination: Secondary | ICD-10-CM

## 2018-01-16 DIAGNOSIS — M542 Cervicalgia: Secondary | ICD-10-CM

## 2018-01-16 DIAGNOSIS — Z0189 Encounter for other specified special examinations: Principal | ICD-10-CM

## 2018-01-16 DIAGNOSIS — E049 Nontoxic goiter, unspecified: Secondary | ICD-10-CM

## 2018-01-16 NOTE — Progress Notes (Signed)
Subjective: Annual biometrics screening  Patient presents for her annual biometric screening. Patient reports eating a healthy, well-rounded diet and getting regular physical activity.  Patient recently established care with a new primary care provider. PCP: Dr. Chancy Milroy.  Patient works for [employer] Patient denies any other issues or concerns.   Review of Systems Unremarkable  Objective  Physical Exam General: Awake, alert and oriented. No acute distress. Well developed, hydrated and nourished. Appears stated age.  HEENT: Supple neck without adenopathy. Sclera is non-icteric. The ear canal is clear without discharge. The tympanic membrane is normal in appearance with normal landmarks and cone of light. Nasal mucosa is pink and moist. Oral mucosa is pink and moist. The pharynx is normal in appearance without tonsillar swelling or exudates.  Skin: Skin in warm, dry and intact without rashes or lesions. Appropriate color for ethnicity. Cardiac: Heart rate and rhythm are normal. No murmurs, gallops, or rubs are auscultated.  Respiratory: The chest wall is symmetric and without deformity. No signs of respiratory distress. Lung sounds are clear in all lobes bilaterally without rales, ronchi, or wheezes.  Neurological: The patient is awake, alert and oriented to person, place, and time with normal speech.  Memory is normal and thought processes intact. No gait abnormalities are appreciated.  Psychiatric: Appropriate mood and affect.   Assessment Annual biometrics screening  Plan  Lipid panel and fasting blood sugar pending - ordered by PCP. Encouraged routine visits with primary care provider.  Encouraged patient to get regular exercise and eat a healthy, well-rounded diet.

## 2018-01-17 LAB — TSH+FREE T4
FREE T4: 1 ng/dL (ref 0.82–1.77)
TSH: 1.45 u[IU]/mL (ref 0.450–4.500)

## 2018-01-17 LAB — CBC WITH DIFFERENTIAL/PLATELET
BASOS ABS: 0 10*3/uL (ref 0.0–0.2)
Basos: 0 %
EOS (ABSOLUTE): 0.1 10*3/uL (ref 0.0–0.4)
Eos: 2 %
HEMATOCRIT: 35.6 % (ref 34.0–46.6)
Hemoglobin: 11.9 g/dL (ref 11.1–15.9)
Immature Grans (Abs): 0 10*3/uL (ref 0.0–0.1)
Immature Granulocytes: 0 %
LYMPHS ABS: 1.9 10*3/uL (ref 0.7–3.1)
Lymphs: 33 %
MCH: 28.1 pg (ref 26.6–33.0)
MCHC: 33.4 g/dL (ref 31.5–35.7)
MCV: 84 fL (ref 79–97)
MONOS ABS: 0.5 10*3/uL (ref 0.1–0.9)
Monocytes: 9 %
Neutrophils Absolute: 3.2 10*3/uL (ref 1.4–7.0)
Neutrophils: 56 %
Platelets: 240 10*3/uL (ref 150–450)
RBC: 4.24 x10E6/uL (ref 3.77–5.28)
RDW: 14.6 % (ref 12.3–15.4)
WBC: 5.7 10*3/uL (ref 3.4–10.8)

## 2018-01-17 LAB — COMPREHENSIVE METABOLIC PANEL
A/G RATIO: 1.8 (ref 1.2–2.2)
ALT: 25 IU/L (ref 0–32)
AST: 28 IU/L (ref 0–40)
Albumin: 4.6 g/dL (ref 3.5–5.5)
Alkaline Phosphatase: 66 IU/L (ref 39–117)
BUN/Creatinine Ratio: 10 (ref 9–23)
BUN: 7 mg/dL (ref 6–24)
Bilirubin Total: 0.6 mg/dL (ref 0.0–1.2)
CALCIUM: 9.2 mg/dL (ref 8.7–10.2)
CO2: 20 mmol/L (ref 20–29)
Chloride: 104 mmol/L (ref 96–106)
Creatinine, Ser: 0.71 mg/dL (ref 0.57–1.00)
GFR, EST AFRICAN AMERICAN: 116 mL/min/{1.73_m2} (ref >59)
GFR, EST NON AFRICAN AMERICAN: 100 mL/min/{1.73_m2} (ref >59)
GLUCOSE: 89 mg/dL (ref 65–99)
Globulin, Total: 2.6 g/dL (ref 1.5–4.5)
Potassium: 4 mmol/L (ref 3.5–5.2)
Sodium: 140 mmol/L (ref 134–144)
TOTAL PROTEIN: 7.2 g/dL (ref 6.0–8.5)

## 2018-01-17 LAB — LIPID PANEL
CHOL/HDL RATIO: 3 ratio (ref 0.0–4.4)
Cholesterol, Total: 136 mg/dL (ref 100–199)
HDL: 45 mg/dL (ref >39)
LDL Calculated: 79 mg/dL (ref 0–99)
Triglycerides: 62 mg/dL (ref 0–149)
VLDL CHOLESTEROL CAL: 12 mg/dL (ref 5–40)

## 2018-01-17 LAB — T3, FREE: T3, Free: 3.4 pg/mL (ref 2.0–4.4)

## 2018-02-07 ENCOUNTER — Telehealth: Payer: Self-pay

## 2018-02-07 NOTE — Telephone Encounter (Signed)
Pt reports she was seen & treated for BV in May by Dr. Gilman Schmidt. Pt states she is experiencing BV again now. She is currently out of town & is requesting a rx until she can get into the office early next week. Cb#[phone]

## 2018-02-07 NOTE — Telephone Encounter (Signed)
Spoke w/pt. Pt states she is having a fishy smelling discharge x3 days. The weekly diflucan has taken care of her recurrent yeast infections. Pt is working in [place] this week & won't return til the weekend. Requests rx to be sent to [place], [place]

## 2018-02-08 ENCOUNTER — Other Ambulatory Visit: Payer: Self-pay | Admitting: Maternal Newborn

## 2018-02-08 DIAGNOSIS — N760 Acute vaginitis: Principal | ICD-10-CM

## 2018-02-08 DIAGNOSIS — B9689 Other specified bacterial agents as the cause of diseases classified elsewhere: Secondary | ICD-10-CM

## 2018-02-08 MED ORDER — CLINDAMYCIN HCL 300 MG PO CAPS: 300.0000 mg | ORAL_CAPSULE | Freq: Two times a day (BID) | ORAL | 0 refills | Status: AC

## 2018-02-08 NOTE — Telephone Encounter (Signed)
Thank You. Informed patient. She was very grateful.

## 2018-02-08 NOTE — Progress Notes (Signed)
[facility] [place]

## 2018-02-08 NOTE — Telephone Encounter (Signed)
Pt left vmail @ 4:45 p.m on 07/10 following up on previous conversation with Crystal S. Advised pt message routed to MD. MD not in office today but still may review message @ hospital. If no response by 2 pm will speak w/ MD in office about refilling med. Patient verbalized understanding.

## 2018-02-08 NOTE — Telephone Encounter (Signed)
Rx sent to requested pharmacy in [place].

## 2018-03-14 ENCOUNTER — Ambulatory Visit: Payer: Self-pay

## 2018-03-15 ENCOUNTER — Ambulatory Visit: Payer: Self-pay

## 2018-03-15 ENCOUNTER — Ambulatory Visit: Payer: Self-pay | Admitting: Family Medicine

## 2018-03-15 VITALS — BP 124/78 | HR 79 | Resp 17

## 2018-03-15 DIAGNOSIS — J309 Allergic rhinitis, unspecified: Secondary | ICD-10-CM

## 2018-03-15 MED ORDER — AZELASTINE HCL 0.05 % OP SOLN: 1.0000 [drp] | Freq: Two times a day (BID) | OPHTHALMIC | 1 refills | Status: DC

## 2018-03-15 NOTE — Progress Notes (Signed)
Subjective: watering eyes     Alexandria Anderson is a 52 y.o. female who presents for evaluation and treatment of bilateral eye watering intermittently for the last few years.  Patient describes symptoms as episodic with no known trigger.  Current episode began approximately 2 weeks ago.  Patient reports that she has no symptoms upon waking up but that throughout the day her eyes become tired, water, and slightly red.  Patient also endorses mild clear rhinorrhea.  Patient has seen ophthalmology on 3 different occasions, had multiple testing performed, which she reports was all normal.  Ophthalmology prescribed multiple different types of drops in an attempt to improve symptoms without any success.  Patient cannot remember all the names of these, except for tobramycin, Ilevro, and olopatadine.  Patient continues to use the olopatadine drops but reports minimal relief.  Patient was told that she has occasional eye irritation and that her ophthalmologist is unsure why.  Patient has a history of allergic rhinitis, which she uses Zyrtec and Flonase for on occasion but not regularly.  Patient denies itching, pain, swelling, photophobia, her eyes being stuck together in the morning, mucoid or purulent drainage, vision changes, sore throat, wheezing, shortness of breath, headache, vomiting, fever, chills, malaise, painful EOM, cough, nasal congestion, periorbital/orbital swelling/pain, foreign body or foreign body sensation. Denies contact lens use.  Patient reports symptoms appear to be worse during the week while she is at work than on the weekends when she is at home.  Patient has seen an allergist in the past.  Denies any ocular history.  Review of Systems Pertinent items noted in HPI and remainder of comprehensive ROS otherwise negative.    Objective:    BP 124/78 (BP Location: Left Arm, Patient Position: Sitting, Cuff Size: Normal)   Pulse 79   Resp 17   LMP 12/30/2014 (Approximate)   SpO2 100%  General:  alert, cooperative, appears stated age and no distress  Eyes:  conjunctivae/corneas clear. PERRL, EOM's intact. Fundi benign.  No foreign body noted.  No eyelid or periorbital/orbital erythema or edema.  No pain with EOM movement.  No preauricular lymphadenopathy.  No discharge or drainage noted.  Completely normal exam.  Ears: normal appearance  Nose: clear rhinorrhea, septum midline and pale boggy mucosa  Oropharynx:   normal  Neck:  no adenopathy and supple, symmetrical, trachea midline       Assessment:   Epiphora    Plan:   Unclear etiology- possibly allergic rhinitis, as patient does not take medications regularly.  We will attempt treating it as such.  Patient lacks red flag symptoms/signs. Prescribed an alternative antihistamine eyedrop, advised patient to stop using olopatadine eyedrops.  Advised patient to start using Flonase daily as opposed to as needed to see if this improves symptoms.  Patient was previously using only 1 spray in each nostril, advised her to use 2 sprays in each nostril.  Discussed the addition of oral antihistamines if her symptoms are not controlled after few weeks of trialing these new medications.  Discussed side/adverse effects of all medications. Allergen avoidance discussed. Patient has a scheduled appointment with her ophthalmologist at the beginning of September, advised patient to follow-up with ophthalmologist. Discussed the option of referring her back to an allergist, patient declined doing this at this time. Red flag symptoms and indications to seek medical care discussed.

## 2018-05-03 DIAGNOSIS — E040 Nontoxic diffuse goiter: Secondary | ICD-10-CM | POA: Insufficient documentation

## 2018-05-03 DIAGNOSIS — J301 Allergic rhinitis due to pollen: Secondary | ICD-10-CM | POA: Insufficient documentation

## 2018-05-03 DIAGNOSIS — K219 Gastro-esophageal reflux disease without esophagitis: Secondary | ICD-10-CM | POA: Insufficient documentation

## 2018-05-08 DIAGNOSIS — F40298 Other specified phobia: Secondary | ICD-10-CM | POA: Insufficient documentation

## 2018-07-03 ENCOUNTER — Ambulatory Visit: Payer: Self-pay

## 2018-07-04 ENCOUNTER — Encounter: Payer: Self-pay | Admitting: Emergency Medicine

## 2018-07-04 ENCOUNTER — Ambulatory Visit: Payer: Self-pay | Admitting: Emergency Medicine

## 2018-07-04 ENCOUNTER — Ambulatory Visit: Payer: Self-pay

## 2018-07-04 VITALS — BP 114/78 | HR 88 | Temp 98.5°F | Resp 14 | Ht 65.0 in | Wt 145.0 lb

## 2018-07-04 DIAGNOSIS — J029 Acute pharyngitis, unspecified: Secondary | ICD-10-CM

## 2018-07-04 LAB — POCT RAPID STREP A (OFFICE): Rapid Strep A Screen: NEGATIVE

## 2018-07-04 MED ORDER — AMOXICILLIN 875 MG PO TABS: ORAL_TABLET | ORAL | 0 refills | Status: DC

## 2018-07-04 MED ORDER — BENZONATATE 200 MG PO CAPS: ORAL_CAPSULE | ORAL | 0 refills | Status: DC

## 2018-07-04 NOTE — Progress Notes (Signed)
  Subjective:     Patient ID: Alexandria Anderson, female   DOB: 25-Jan-1966, 52 y.o.   MRN: [id number]  HPI 4 days of worsening sore throat and hoarseness.  No definite fever.  Some sinus congestion and clear postnasal drainage but no discolored rhinorrhea. Occasional coughing, but the cough could be severe    Severity: moderate Tried OTC meds without significant relief.  Symptoms:  + Low-grade fever  + Swollen neck glands No Recent Strep Exposure     No Myalgias No Headache No Rash  No Discolored Nasal Mucus No Allergy symptoms No sinus pain/pressure No itchy/red eyes No earache  No Drooling No Trismus  No Nausea No Vomiting No Abdominal pain No Diarrhea No Reflux symptoms  No Breathing Difficulty No Shortness of Breath No pleuritic pain No Wheezing No Hemoptysis        Physical Exam  Constitutional: She is oriented to person, place, and time. She appears well-developed and well-nourished. She is cooperative.  Non-toxic appearance. No distress.  HENT:  Head: Normocephalic and atraumatic.  Right Ear: Tympanic membrane, external ear and ear canal normal.  Left Ear: Tympanic membrane, external ear and ear canal normal.  Nose: Nose normal. Right sinus exhibits no maxillary sinus tenderness and no frontal sinus tenderness. Left sinus exhibits no maxillary sinus tenderness and no frontal sinus tenderness.  Mouth/Throat:   Posterior oropharyngeal erythema present.-Posterior pharynx is very red without exudate.   Airway intact.  No oropharyngeal exudate or posterior oropharyngeal edema.  Eyes: Conjunctivae are normal. No scleral icterus.  Neck: Neck supple.  Cardiovascular: Normal rate, regular rhythm and normal heart sounds.  No murmur heard. Pulmonary/Chest: Effort normal and breath sounds normal. No stridor. No respiratory distress. She has no wheezes. She has no rales.  Musculoskeletal: She exhibits no edema.  Lymphadenopathy:    She has cervical adenopathy.   Right cervical: Superficial cervical adenopathy present. No deep cervical and no posterior cervical adenopathy present.      Left cervical: Superficial cervical adenopathy present. No deep cervical and no posterior cervical adenopathy present.  Neurological: She is alert and oriented to person, place, and time.  Skin: Skin is warm and dry. No rash Psychiatric: She has a normal mood and affect.  Nursing note and vitals reviewed.    Vitals:   07/04/18 1109  BP: 114/78  Pulse: 88  Temp: 98.5 F (36.9 C)        Assessment:     Severe pharyngitis/URI. Rapid strep test today negative, strep culture sent. It is possible this could be mixed viral but I am suspicious of false negative rapid strep test, or other bacterial causes possible.    Plan:     Treatment options discussed, as well as risks, benefits, alternatives. Patient voiced understanding and agreement with the following plans:     New Prescriptions   AMOXICILLIN (AMOXIL) 875 MG TABLET    Take 1 twice a day X 10 days.   BENZONATATE (TESSALON) 200 MG CAPSULE    Take 1 every 8 hours as needed for cough.    She will hold onto prescriptions and fill if needed.-Parameters discussed. Strep culture pending. Other symptomatic care discussed. Follow-up here or PCP if no better 1 week or sooner if worse or new symptoms. Red flags discussed. Patient voiced understanding and agreement.

## 2018-07-05 LAB — STREP A DNA PROBE: Strep Gp A Direct, DNA Probe: NEGATIVE

## 2018-07-05 NOTE — Progress Notes (Signed)
Please inform strep culture negative

## 2018-07-09 ENCOUNTER — Telehealth: Payer: Self-pay

## 2018-07-09 NOTE — Telephone Encounter (Addendum)
The patient called and stated that she is still not feeling better since her visit on 07/04/18. She stated that she is still having a very dry cough and is still feeling lethargic. She Did fill and has been taking the Tessalon prescribed but feels it has not been working for her. She has also tried multiple OTC medications with no resolve. The patient would like to know if something else could be called in to help her feel better. If a prescription were to be sent in, [facility] [address] [facility] [place].

## 2018-07-10 ENCOUNTER — Telehealth: Payer: Self-pay

## 2018-07-10 MED ORDER — PROMETHAZINE-DM 6.25-15 MG/5ML PO SYRP: ORAL_SOLUTION | ORAL | 0 refills | Status: DC

## 2018-07-10 MED ORDER — PREDNISONE 50 MG PO TABS: 50.0000 mg | ORAL_TABLET | Freq: Every day | ORAL | 0 refills | Status: DC

## 2018-07-10 NOTE — Addendum Note (Signed)
Addended by: Jacqulyn Cane B on: 07/10/2018 08:32 AM   Modules accepted: Orders

## 2018-07-10 NOTE — Telephone Encounter (Signed)
I reviewed her chart and visit from last week.- I will E prescribe a cough medicine and a short course of prednisone which should help the cough. Please explained that sometimes a mild cough can persist even after infection has resolved. If any persistent symptoms after 5-7 more days, or if other concerns, advise office visit here or with PCP.

## 2018-07-10 NOTE — Telephone Encounter (Signed)
The patient called and stated that she prefers not to take Prednisone due to it makes her heart race but will start taking the cough medicine that was prescribed. She wanted to know if an alternate steroid could be called in to [facility] [address] in [place]. I have added Prednisone to her allergy list.

## 2018-07-11 MED ORDER — METHYLPREDNISOLONE 4 MG PO TBPK: ORAL_TABLET | ORAL | 0 refills | Status: DC

## 2018-07-11 NOTE — Addendum Note (Signed)
Addended by: Jacqulyn Cane B on: 07/11/2018 08:35 AM   Modules accepted: Orders

## 2018-07-19 DIAGNOSIS — F411 Generalized anxiety disorder: Secondary | ICD-10-CM | POA: Insufficient documentation

## 2018-09-05 ENCOUNTER — Ambulatory Visit: Payer: Self-pay | Admitting: Emergency Medicine

## 2018-09-05 ENCOUNTER — Other Ambulatory Visit: Payer: Self-pay

## 2018-09-05 VITALS — BP 120/70 | HR 79 | Temp 98.7°F | Resp 14

## 2018-09-05 DIAGNOSIS — M542 Cervicalgia: Secondary | ICD-10-CM

## 2018-09-05 MED ORDER — METHOCARBAMOL 500 MG PO TABS: ORAL_TABLET | ORAL | 0 refills | Status: DC

## 2018-09-05 NOTE — Patient Instructions (Signed)
Please discuss with your primary care physician your neck pain to see if x-rays are indicated. You can alternate ice and heat to help with your neck discomfort. See if the Robaxin helps as a muscle relaxant. Please notify the hospital to see if you can have an evaluation of your workplace to see if your workplace is ergonomically correct. I have written a prescription for you to have massage to your neck muscles once a month
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
## 2018-09-12 ENCOUNTER — Other Ambulatory Visit: Payer: Self-pay | Admitting: Obstetrics and Gynecology

## 2018-09-12 DIAGNOSIS — N951 Menopausal and female climacteric states: Secondary | ICD-10-CM

## 2018-09-12 NOTE — Telephone Encounter (Signed)
Pt needs an appointment before anymore refills.

## 2018-09-12 NOTE — Telephone Encounter (Signed)
She can have one refill but will need to schedule an annual appointment. Thank you Dr. Gilman Schmidt

## 2018-09-12 NOTE — Telephone Encounter (Signed)
Pt has not seen SD[facility], but last saw Gardiner Rhyme 11/2017. Please advise

## 2018-09-19 ENCOUNTER — Ambulatory Visit: Payer: Self-pay | Admitting: Adult Health

## 2018-09-19 VITALS — BP 159/75 | HR 85 | Temp 98.1°F | Resp 16

## 2018-09-19 DIAGNOSIS — H6502 Acute serous otitis media, left ear: Secondary | ICD-10-CM

## 2018-09-19 DIAGNOSIS — R591 Generalized enlarged lymph nodes: Secondary | ICD-10-CM

## 2018-09-19 DIAGNOSIS — J309 Allergic rhinitis, unspecified: Secondary | ICD-10-CM

## 2018-09-19 MED ORDER — NEOMYCIN-POLYMYXIN-HC 3.5-10000-1 OT SOLN: 3.0000 [drp] | Freq: Three times a day (TID) | OTIC | 0 refills | Status: DC

## 2018-09-19 MED ORDER — FLUCONAZOLE 150 MG PO TABS: 150.0000 mg | ORAL_TABLET | Freq: Once | ORAL | 0 refills | Status: AC

## 2018-09-19 MED ORDER — AMOXICILLIN-POT CLAVULANATE 875-125 MG PO TABS: 1.0000 | ORAL_TABLET | Freq: Two times a day (BID) | ORAL | 0 refills | Status: DC

## 2018-09-19 NOTE — Patient Instructions (Addendum)
Otitis Media, Adult  Otitis media means that the middle ear is red and swollen (inflamed) and full of fluid. The condition usually goes away on its own. Follow these instructions at home:  Take over-the-counter and prescription medicines only as told by your doctor.  If you were prescribed an antibiotic medicine, take it as told by your doctor. Do not stop taking the antibiotic even if you start to feel better.  Keep all follow-up visits as told by your doctor. This is important. Contact a doctor if:  You have bleeding from your nose.  There is a lump on your neck.  You are not getting better in 5 days.  You feel worse instead of better. Get help right away if:  You have pain that is not helped with medicine.  You have swelling, redness, or pain around your ear.  You get a stiff neck.  You cannot move part of your face (paralyzed).  You notice that the bone behind your ear hurts when you touch it.  You get a very bad headache. Summary  Otitis media means that the middle ear is red, swollen, and full of fluid.  This condition usually goes away on its own. In some cases, treatment may be needed.  If you were prescribed an antibiotic medicine, take it as told by your doctor. This information is not intended to replace advice given to you by your health care provider. Make sure you discuss any questions you have with your health care provider. Document Released: 01/04/2008 Document Revised: 08/08/2016 Document Reviewed: 08/08/2016 Elsevier Interactive Patient Education  2019 [employer] Nasal Allergies Nasal allergies are a reaction to allergens in the air. Allergens are particles in the air that cause your body to have an allergic reaction. Nasal allergies are not passed from person to person (are not contagious). They cannot be cured, but they can be controlled. What are the causes? Seasonal nasal allergies (hay fever) are caused by pollen allergens that come from  grasses, trees, and weeds. Year-round nasal allergies (perennial allergic rhinitis) are caused by allergens such as house dust mites, pet dander, and mold spores. What increases the risk? The following factors may make you more likely to develop this condition:  Having certain health conditions. These include: ? Other types of allergies, such as food allergies. ? Asthma. ? Eczema.  Having a close relative who has allergies or asthma.  Exposure to house dust, pollen, dander, or other allergens at home or at work.  Exposure to air pollution or secondhand smoke when you were a child. What are the signs or symptoms? Symptoms of this condition include:  Sneezing.  Runny nose or stuffy nose (congestion).  Watery (tearing) eyes.  Itchy eyes, nose, mouth, throat, skin, or other area.  Sore throat.  Headache.  Decreased sense of smell or taste.  Fatigue. This may occur if you have trouble sleeping due to allergies.  Swollen eyelids. How is this diagnosed? This condition is diagnosed with a medical history and physical exam. Allergy testing may be done to determine exactly what triggers your nasal allergies. How is this treated? There is no cure for nasal allergies. Treatment focuses on controlling your symptoms, and it may include:  Medicines that block allergy symptoms. These may include allergy shots, nasal sprays, and oral antihistamines.  Avoiding the allergen. Follow these instructions at home:  Avoid the allergen that is causing your symptoms, if possible.  Keep windows closed. If possible, use air conditioning when pollen counts are high.  Do not use fans in your home.  Do not hang clothes outside to dry.  Wear sunglasses to keep pollen out of your eyes.  Wash your hands right away after you touch household pets.  Take over-the-counter and prescription medicines only as told by your health care provider.  Keep all follow-up visits as told by your health care  provider. This is important. Contact a health care provider if:  You have a fever.  You develop a cough that does not go away (is persistent).  You start to wheeze.  Your symptoms do not improve with treatment.  You have thick nasal discharge.  You start to have nosebleeds. Get help right away if:  Your tongue or your lips are swollen.  You have trouble breathing.  You feel light-headed or you feel like you are going to faint.  You have cold sweats. This information is not intended to replace advice given to you by your health care provider. Make sure you discuss any questions you have with your health care provider. Document Released: 07/18/2005 Document Revised: 12/21/2015 Document Reviewed: 01/28/2015 Elsevier Interactive Patient Education  2019 [employer].

## 2018-09-19 NOTE — Progress Notes (Signed)
[name]  Subjective:     Patient ID: Alexandria Anderson, female   DOB: [birth year], 52 y.o.   MRN: [id number] Patient is a 53 year old female in no acute distress who presents the clinic with complaints of bilateral ear pain. Nasal allergies. Mild sore throat  Otalgia   There is pain in both ears. This is a new problem. The current episode started in the past 7 days (last thursday ). The problem occurs constantly. There has been no fever. Associated symptoms include rhinorrhea. Pertinent negatives include no abdominal pain, coughing, diarrhea, ear discharge, headaches, hearing loss, neck pain, rash, sore throat or vomiting. Treatments tried: flonase and had 12 hour sudafed     She reports the 12 hour sudafed did help with ear pain. Left ear hurts more than right ear.  She denies any ear drainage.  Patient  denies any fever, body aches,chills, rash, chest pain, shortness of breath, nausea, vomiting, or diarrhea.    Allergies  Allergen Reactions  . Prednisone Palpitations   Patient Active Problem List   Diagnosis Date Noted  . Climacteric 08/16/2017  . Blood in stool    .  Review of Systems  Constitutional: Negative for activity change, appetite change, chills, diaphoresis, fatigue, fever and unexpected weight change.  HENT: Positive for congestion, ear pain and rhinorrhea. Negative for dental problem, drooling, ear discharge, facial swelling, hearing loss, mouth sores, nosebleeds, postnasal drip, sinus pressure, sinus pain, sneezing, sore throat, tinnitus, trouble swallowing and voice change.   Eyes: Negative.  Negative for photophobia, pain, discharge, redness, itching and visual disturbance.  Respiratory: Negative for apnea, cough, choking, chest tightness, shortness of breath, wheezing and stridor.   Cardiovascular: Negative for chest pain, palpitations and leg swelling.  Gastrointestinal: Negative for abdominal distention, abdominal pain, anal  bleeding, blood in stool, constipation, diarrhea, nausea, rectal pain and vomiting.  Genitourinary: Negative.   Musculoskeletal: Negative for arthralgias, back pain, gait problem, joint swelling, myalgias, neck pain and neck stiffness.  Skin: Negative for color change, pallor, rash and wound.  Allergic/Immunologic:        -- Prednisone -- Palpitations She reports " just an intolerance I am not allergic to prednisone"  Neurological: Negative.  Negative for headaches.  Hematological: Negative.   Psychiatric/Behavioral: Negative.        Objective:   Physical Exam Vitals signs and nursing note reviewed.  Constitutional:      General: She is not in acute distress.    Appearance: She is well-developed and normal weight. She is not ill-appearing, toxic-appearing or diaphoretic.  HENT:     Head: Normocephalic and atraumatic.     Jaw: There is normal jaw occlusion.     Salivary Glands: Right salivary gland is not diffusely enlarged or tender. Left salivary gland is not diffusely enlarged or tender.     Right Ear: Hearing, ear canal and external ear normal. No drainage, swelling or tenderness. A middle ear effusion is present. There is no impacted cerumen. Tympanic membrane is not erythematous.     Left Ear: Hearing and external ear normal. Swelling and tenderness present. No drainage. A middle ear effusion is present. There is no impacted cerumen. Tympanic membrane is erythematous.     Nose: Congestion and rhinorrhea present. No mucosal edema.     Right Sinus: No maxillary sinus tenderness or frontal sinus tenderness.     Left Sinus: No maxillary sinus tenderness or frontal sinus tenderness.     Mouth/Throat:  Lips: Pink.     Mouth: Mucous membranes are moist. No oral lesions.     Pharynx: Uvula midline. Posterior oropharyngeal erythema present. No pharyngeal swelling, oropharyngeal exudate or uvula swelling.     Tonsils: No tonsillar exudate or tonsillar abscesses. Swelling: 0 on the  right. 0 on the left.  Eyes:     Conjunctiva/sclera: Conjunctivae normal.  Neck:     Musculoskeletal: Normal range of motion.  Cardiovascular:     Rate and Rhythm: Normal rate and regular rhythm.     Heart sounds: Normal heart sounds. No murmur. No friction rub. No gallop.   Pulmonary:     Effort: Pulmonary effort is normal. No respiratory distress.     Breath sounds: No stridor. No wheezing, rhonchi or rales.  Chest:     Chest wall: No tenderness.  Abdominal:     Palpations: Abdomen is soft.     Tenderness: There is no abdominal tenderness. There is no rebound.  Lymphadenopathy:     Head:     Right side of head: No submental, submandibular, tonsillar, preauricular, posterior auricular or occipital adenopathy.     Left side of head: No submental, submandibular, tonsillar, preauricular, posterior auricular or occipital adenopathy.     Cervical: Cervical adenopathy present.     Right cervical: Superficial cervical adenopathy (mild bilateral soft mobile mild tenderness ) present. No deep or posterior cervical adenopathy.    Left cervical: Superficial cervical adenopathy present. No deep or posterior cervical adenopathy.  Skin:    General: Skin is warm and dry.     Capillary Refill: Capillary refill takes less than 2 seconds.     Nails: There is no clubbing.   Neurological:     Mental Status: She is alert and oriented to person, place, and time.  Psychiatric:        Mood and Affect: Mood normal.        Behavior: Behavior normal.        Assessment:     Non-recurrent acute serous otitis media of left ear  Allergic rhinitis, unspecified seasonality, unspecified trigger  Lymphadenopathy      Plan:     [name] was seen today for ear fullness and sore throat.  Diagnoses and all orders for this visit:  Non-recurrent acute serous otitis media of left ear  Allergic rhinitis, unspecified seasonality, unspecified trigger  Lymphadenopathy  Other orders -      amoxicillin-clavulanate (AUGMENTIN) 875-125 MG tablet; Take 1 tablet by mouth 2 (two) times daily. -     fluconazole (DIFLUCAN) 150 MG tablet; Take 1 tablet (150 mg total) by mouth once for 1 dose. Only if yeast symptoms develop- if persists see Gynecology -     neomycin-polymyxin-hydrocortisone (CORTISPORIN) OTIC solution; Place 3 drops into the left ear 3 (three) times daily.  Start Zyrtec orally per package instructions and Flonase nasal spray per package instructions continue  Patient denies true prednisone allergy- just intolerance due to increased heart rate. She has used ear drops above without any difficulty in past. Advised patient call the office or your primary care doctor for an appointment if no improvement within 72 hours or if any symptoms change or worsen at any time  Advised ER or urgent Care if after hours or on weekend. Call 911 for emergency symptoms at any time.Patinet verbalized understanding of all instructions given/reviewed and treatment plan and has no further questions or concerns at this time.    Patient verbalized understanding of all instructions given and denies any  further questions at this time.

## 2018-10-01 ENCOUNTER — Encounter: Payer: Self-pay | Admitting: Obstetrics and Gynecology

## 2018-10-01 ENCOUNTER — Ambulatory Visit (INDEPENDENT_AMBULATORY_CARE_PROVIDER_SITE_OTHER): Payer: Managed Care, Other (non HMO) | Admitting: Obstetrics and Gynecology

## 2018-10-01 VITALS — BP 110/60 | HR 92 | Ht 65.0 in | Wt 156.0 lb

## 2018-10-01 DIAGNOSIS — N760 Acute vaginitis: Secondary | ICD-10-CM

## 2018-10-01 DIAGNOSIS — Z1231 Encounter for screening mammogram for malignant neoplasm of breast: Secondary | ICD-10-CM

## 2018-10-01 DIAGNOSIS — Z01419 Encounter for gynecological examination (general) (routine) without abnormal findings: Secondary | ICD-10-CM

## 2018-10-01 DIAGNOSIS — Z79890 Hormone replacement therapy (postmenopausal): Secondary | ICD-10-CM

## 2018-10-01 DIAGNOSIS — Z5181 Encounter for therapeutic drug level monitoring: Secondary | ICD-10-CM

## 2018-10-01 MED ORDER — ESTRADIOL 0.075 MG/24HR TD PTTW: 1.0000 | MEDICATED_PATCH | TRANSDERMAL | 4 refills | Status: DC

## 2018-10-01 MED ORDER — FLUCONAZOLE 150 MG PO TABS: 150.0000 mg | ORAL_TABLET | ORAL | 6 refills | Status: DC

## 2018-10-01 NOTE — Progress Notes (Signed)
Gynecology Annual Exam  PCP: Alexandria Anderson, No Pcp Per  Chief Complaint:  Chief Complaint  Alexandria Anderson presents with  . Gynecologic Exam    Having alot of hot flashes on the patch     History of Present Illness:Alexandria Anderson is a 53 y.o. [id number] presents for annual exam. The Alexandria Anderson has no complaints today.   LMP: Alexandria Anderson's last menstrual period was 12/30/2014 (approximate). Menarche:not applicable  No vaginal bleeding: s/p hysterectomy Postcoital Bleeding: no Dysmenorrhea: no  The Alexandria Anderson is sexually active. She denies dyspareunia.  The Alexandria Anderson does perform self breast exams.  There is no notable family history of breast or ovarian cancer in her family.  The Alexandria Anderson wears seatbelts: yes.   The Alexandria Anderson has regular exercise: yes.    The Alexandria Anderson denies current symptoms of depression.     Review of Systems: ROS  Past Medical History:  Past Medical History:  Diagnosis Date  . Allergy   . Anemia   . Asthma    as a child  . GERD (gastroesophageal reflux disease)   . PONV (postoperative nausea and vomiting)    after first foot surgery  . Vitamin D deficiency     Past Surgical History:  Past Surgical History:  Procedure Laterality Date  . COLONOSCOPY WITH PROPOFOL N/A 10/07/2016   Procedure: COLONOSCOPY WITH PROPOFOL;  Surgeon: Lucilla Lame, MD;  Location: [place];  Service: Endoscopy;  Laterality: N/A;  . CYSTOSCOPY N/A 02/03/2015   Procedure: CYSTOSCOPY;  Surgeon: Will Bonnet, MD;  Location: [facility];  Service: Gynecology;  Laterality: N/A;  . HAMMER TOE SURGERY Bilateral   . LAPAROSCOPIC HYSTERECTOMY N/A 02/03/2015   Procedure: HYSTERECTOMY TOTAL LAPAROSCOPIC/BILATERAL SALPINGECTOMY;  Surgeon: Will Bonnet, MD;  Location: [facility];  Service: Gynecology;  Laterality: N/A;    Gynecologic History:  Alexandria Anderson's last menstrual period was 12/30/2014 (approximate). Last Pap: Results 2016 hysterectomy with normal pathology  Last mammogram: 01/17/2017 Results were:  BI-RAD II  Obstetric History: [id number]  Family History:  Family History  Problem Relation Age of Onset  . Hypertension Mother   . Hypercholesterolemia Mother   . Hyperlipidemia Mother   . Heart disease Mother   . Alcohol abuse Father   . Cirrhosis Father   . Breast cancer Neg Hx     Social History:  Social History   Socioeconomic History  . Marital status: Married    Spouse name: Not on file  . Number of children: Not on file  . Years of education: Not on file  . Highest education level: Not on file  Occupational History  . Not on file  Social Needs  . Financial resource strain: Not on file  . Food insecurity:    Worry: Not on file    Inability: Not on file  . Transportation needs:    Medical: Not on file    Non-medical: Not on file  Tobacco Use  . Smoking status: Never Smoker  . Smokeless tobacco: Never Used  Substance and Sexual Activity  . Alcohol use: Yes    Alcohol/week: 2.0 standard drinks    Types: 2 Glasses of wine per week    Comment:    . Drug use: No  . Sexual activity: Yes    Birth control/protection: Post-menopausal, Surgical  Lifestyle  . Physical activity:    Days per week: 3 days    Minutes per session: 60 min  . Stress: To some extent  Relationships  . Social connections:    Talks on phone:  Not on file    Gets together: Not on file    Attends religious service: Not on file    Active member of club or organization: Not on file    Attends meetings of clubs or organizations: Not on file    Relationship status: Not on file  . Intimate partner violence:    Fear of current or ex partner: Not on file    Emotionally abused: Not on file    Physically abused: Not on file    Forced sexual activity: Not on file  Other Topics Concern  . Not on file  Social History Narrative  . Not on file    Allergies:  Allergies  Allergen Reactions  . Prednisone Palpitations    Medications: Prior to Admission medications   Medication Sig Start Date End  Date Taking? Authorizing Provider  albuterol (PROVENTIL HFA;VENTOLIN HFA) 108 (90 Base) MCG/ACT inhaler  09/11/18  Yes [provider]  baclofen (LIORESAL) 10 MG tablet Take 1 tablet (10 mg total) by mouth 3 (three) times daily as needed for muscle spasms. For muscle spasms 10/31/17  Yes McManama, Franchot Mimes, FNP  cetirizine (ZYRTEC) 10 MG tablet Take 1 tablet (10 mg total) by mouth daily. 11/03/16  Yes Fisher, Linden Dolin, PA-C  fluconazole (DIFLUCAN) 150 MG tablet Take 1 tablet (150 mg total) by mouth once a week. 10/01/18  Yes ,  R, MD  fluticasone (FLONASE) 50 MCG/ACT nasal spray 1-2 sprays into each nostril daily 10/31/17  Yes McManama, Franchot Mimes, FNP  ibuprofen (ADVIL,MOTRIN) 600 MG tablet TAKE 1 TABLET BY MOUTH EVERY 6 HOURS AS NEEDED FOR MILD PAIN OR CRAMPING 10/31/17  Yes McManama, Franchot Mimes, FNP  omeprazole (PRILOSEC) 20 MG capsule Take 20 mg by mouth daily. 11/09/16  Yes [provider]  estradiol (VIVELLE-DOT) 0.075 MG/24HR Place 1 patch onto the skin 2 (two) times a week. 10/01/18   Homero Fellers, MD    Physical Exam Vitals: Blood pressure 110/60, pulse 92, height 5\' 5"  (1.651 m), weight 156 lb (70.8 kg), last menstrual period 12/30/2014.  General: NAD HEENT: normocephalic, anicteric Thyroid: no enlargement, no palpable nodules Pulmonary: No increased work of breathing, CTAB Cardiovascular: RRR, distal pulses 2+ Breast: Breast symmetrical, no tenderness, no palpable nodules or masses, no skin or nipple retraction present, no nipple discharge.  No axillary or supraclavicular lymphadenopathy. Abdomen: NABS, soft, non-tender, non-distended.  Umbilicus without lesions.  No hepatomegaly, splenomegaly or masses palpable. No evidence of hernia  Genitourinary:  External: Normal external female genitalia.  Normal urethral meatus, normal Bartholin's and Skene's glands.    Vagina: Normal vaginal mucosa, no evidence of prolapse.    Cervix: absent  Uterus:  absent  Adnexa: ovaries non-enlarged, no adnexal masses  Rectal: deferred  Lymphatic: no evidence of inguinal lymphadenopathy Extremities: no edema, erythema, or tenderness Neurologic: Grossly intact Psychiatric: mood appropriate, affect full  Female chaperone present for pelvic and breast  portions of the physical exam     Assessment: 53 y.o. [id number] routine annual exam  Plan: Problem List Items Addressed This Visit    None    Visit Diagnoses    Screening mammogram, encounter for    -  Primary   Relevant Orders   MM DIGITAL SCREENING BILATERAL   Recurrent vaginitis       Relevant Medications   fluconazole (DIFLUCAN) 150 MG tablet   Encounter for monitoring postmenopausal estrogen replacement therapy       Relevant Medications   estradiol (VIVELLE-DOT) 0.075 MG/24HR  1) Mammogram - recommend yearly screening mammogram.  Mammogram Was ordered today  2) STI screening  was offered and declined  3) ASCCP guidelines and rational discussed.  Alexandria Anderson opts for discontinue secondary to prior hysterectomy screening interval  4) Osteoporosis  - per USPTF routine screening DEXA at age 36 - FRAX 44 year major fracture risk 1.6,  10 year hip fracture risk 0.1  Consider FDA-approved medical therapies in postmenopausal women and men aged 76 years and older, based on the following: a) A hip or vertebral (clinical or morphometric) fracture b) T-score ? -2.5 at the femoral neck or spine after appropriate evaluation to exclude secondary causes C) Low bone mass (T-score between -1.0 and -2.5 at the femoral neck or spine) and a 10-year probability of a hip fracture ? 3% or a 10-year probability of a major osteoporosis-related fracture ? 20% based on the [place]-adapted WHO algorithm   5) Routine healthcare maintenance including cholesterol, diabetes screening discussed managed by PCP  6) Colonoscopy up to date, done in 2019, told needs next in 10 years.  Screening recommended starting at  age 24 for average risk individuals, age 64 for individuals deemed at increased risk (including African Americans) and recommended to continue until age 33.  For Alexandria Anderson age 36-85 individualized approach is recommended.  Gold standard screening is via colonoscopy, Cologuard screening is an acceptable alternative for Alexandria Anderson unwilling or unable to undergo colonoscopy.  "Colorectal cancer screening for average?risk adults: 2018 guideline update from the American Cancer Society"[place]: A Cancer Journal for Clinicians: Dec 28, 2016   7) Estrogen replacement not sufficient for symptoms. Having hot flashes every hour. Will increase dose of estrogen patches. Follow up in 1 months  8) Alexandria Anderson continuing to take weekly or every other week diflucan which has significantly improved her recurrent yeast infections. She is not able to completely stop the medication though. Refill sent, discussed adding probiotic. She does eat a daily yogurt.   9) Return in about 1 year (around 10/01/2019) for annual.   Jackie Prows MD [place], [facility] 10/01/2018 2:12 PM

## 2018-10-02 ENCOUNTER — Encounter: Payer: Self-pay | Admitting: Obstetrics and Gynecology

## 2018-12-18 ENCOUNTER — Other Ambulatory Visit: Payer: Self-pay | Admitting: Obstetrics and Gynecology

## 2019-01-01 ENCOUNTER — Encounter: Payer: Self-pay | Admitting: Adult Health

## 2019-01-01 ENCOUNTER — Other Ambulatory Visit: Payer: Self-pay

## 2019-01-01 ENCOUNTER — Ambulatory Visit: Payer: Managed Care, Other (non HMO) | Admitting: Adult Health

## 2019-01-01 VITALS — BP 112/70 | HR 77 | Temp 98.1°F | Resp 16

## 2019-01-01 DIAGNOSIS — M545 Low back pain, unspecified: Secondary | ICD-10-CM

## 2019-01-01 DIAGNOSIS — S46812S Strain of other muscles, fascia and tendons at shoulder and upper arm level, left arm, sequela: Secondary | ICD-10-CM

## 2019-01-01 DIAGNOSIS — M542 Cervicalgia: Secondary | ICD-10-CM | POA: Diagnosis not present

## 2019-01-01 DIAGNOSIS — J3089 Other allergic rhinitis: Secondary | ICD-10-CM | POA: Diagnosis not present

## 2019-01-01 LAB — POCT URINALYSIS DIPSTICK
Bilirubin, UA: NEGATIVE
Blood, UA: NEGATIVE
Glucose, UA: NEGATIVE
Ketones, UA: NEGATIVE
Leukocytes, UA: NEGATIVE
Nitrite, UA: NEGATIVE
Protein, UA: NEGATIVE
Spec Grav, UA: 1.02 (ref 1.010–1.025)
Urobilinogen, UA: 0.2 E.U./dL
pH, UA: 5.5 (ref 5.0–8.0)

## 2019-01-01 MED ORDER — IBUPROFEN 800 MG PO TABS: 800.0000 mg | ORAL_TABLET | Freq: Three times a day (TID) | ORAL | 0 refills | Status: AC | PRN

## 2019-01-01 NOTE — Patient Instructions (Signed)
Cervical Sprain  A cervical sprain is a stretch or tear in one or more of the tough, cord-like tissues that connect bones (ligaments) in the neck. Cervical sprains can range from mild to severe. Severe cervical sprains can cause the spinal bones (vertebrae) in the neck to be unstable. This can lead to spinal cord damage and can result in serious nervous system problems. The amount of time that it takes for a cervical sprain to get better depends on the cause and extent of the injury. Most cervical sprains heal in 4-6 weeks. What are the causes? Cervical sprains may be caused by an injury (trauma), such as from a motor vehicle accident, a fall, or sudden forward and backward whipping movement of the head and neck (whiplash injury). Mild cervical sprains may be caused by wear and tear over time, such as from poor posture, sitting in a chair that does not provide support, or looking up or down for long periods of time. What increases the risk? The following factors may make you more likely to develop this condition:  Participating in activities that have a high risk of trauma to the neck. These include contact sports, auto racing, gymnastics, and diving.  Taking risks when driving or riding in a motor vehicle, such as speeding.  Having osteoarthritis of the spine.  Having poor strength and flexibility of the neck.  A previous neck injury.  Having poor posture.  Spending a lot of time in certain positions that put stress on the neck, such as sitting at a computer for long periods of time. What are the signs or symptoms? Symptoms of this condition include:  Pain, soreness, stiffness, tenderness, swelling, or a burning sensation in the front, back, or sides of the neck.  Sudden tightening of neck muscles that you cannot control (muscle spasms).  Pain in the shoulders or upper back.  Limited ability to move the neck.  Headache.  Dizziness.  Nausea.  Vomiting.  Weakness, numbness,  or tingling in a hand or an arm. Symptoms may develop right away after injury, or they may develop over a few days. In some cases, symptoms may go away with treatment and return (recur) over time. How is this diagnosed? This condition may be diagnosed based on:  Your medical history.  Your symptoms.  Any recent injuries or known neck problems that you have, such as arthritis in the neck.  A physical exam.  Imaging tests, such as: ? X-rays. ? MRI. ? CT scan. How is this treated? This condition is treated by resting and icing the injured area and doing physical therapy exercises. Depending on the severity of your condition, treatment may also include:  Keeping your neck in place (immobilized) for periods of time. This may be done using: ? A cervical collar. This supports your chin and the back of your head. ? A cervical traction device. This is a sling that holds up your head. This removes weight and pressure from your neck, and it may help to relieve pain.  Medicines that help to relieve pain and inflammation.  Medicines that help to relax your muscles (muscle relaxants).  Surgery. This is rare. Follow these instructions at home: If you have a cervical collar:   Wear it as told by your health care provider. Do not remove the collar unless instructed by your health care provider.  Ask your health care provider before you make any adjustments to your collar.  If you have long hair, keep it outside of the   collar.  Ask your health care provider if you can remove the collar for cleaning and bathing. If you are allowed to remove the collar for cleaning or bathing: ? Follow instructions from your health care provider about how to remove the collar safely. ? Clean the collar by wiping it with mild soap and water and drying it completely. ? If your collar has removable pads, remove them every 1-2 days and wash them by hand with soap and water. Let them air-dry completely before you  put them back in the collar. ? Check your skin under the collar for irritation or sores. If you see any, tell your health care provider. Managing pain, stiffness, and swelling   If directed, use a cervical traction device as told by your health care provider.  If directed, apply heat to the affected area before you do your physical therapy or as often as told by your health care provider. Use the heat source that your health care provider recommends, such as a moist heat pack or a heating pad. ? Place a towel between your skin and the heat source. ? Leave the heat on for 20-30 minutes. ? Remove the heat if your skin turns bright red. This is especially important if you are unable to feel pain, heat, or cold. You may have a greater risk of getting burned.  If directed, put ice on the affected area: ? Put ice in a plastic bag. ? Place a towel between your skin and the bag. ? Leave the ice on for 20 minutes, 2-3 times a day. Activity  Do not drive while wearing a cervical collar. If you do not have a cervical collar, ask your health care provider if it is safe to drive while your neck heals.  Do not drive or use heavy machinery while taking prescription pain medicine or muscle relaxants, unless your health care provider approves.  Do not lift anything that is heavier than 10 lb (4.5 kg) until your health care provider tells you that it is safe.  Rest as directed by your health care provider. Avoid positions and activities that make your symptoms worse. Ask your health care provider what activities are safe for you.  If physical therapy was prescribed, do exercises as told by your health care provider or physical therapist. General instructions  Take over-the-counter and prescription medicines only as told by your health care provider.  Do not use any products that contain nicotine or tobacco, such as cigarettes and e-cigarettes. These can delay healing. If you need help quitting, ask your  health care provider.  Keep all follow-up visits as told by your health care provider or physical therapist. This is important. How is this prevented? To prevent a cervical sprain from happening again:  Use and maintain good posture. Make any needed adjustments to your workstation to help you use good posture.  Exercise regularly as directed by your health care provider or physical therapist.  Avoid risky activities that may cause a cervical sprain. Contact a health care provider if:  You have symptoms that get worse or do not get better after 2 weeks of treatment.  You have pain that gets worse or does not get better with medicine.  You develop new, unexplained symptoms.  You have sores or irritated skin on your neck from wearing your cervical collar. Get help right away if:  You have severe pain.  You develop numbness, tingling, or weakness in any part of your body.  You cannot move   a part of your body (you have paralysis).  You have neck pain along with: ? Severe dizziness. ? Headache. Summary  A cervical sprain is a stretch or tear in one or more of the tough, cord-like tissues that connect bones (ligaments) in the neck.  Cervical sprains may be caused by an injury (trauma), such as from a motor vehicle accident, a fall, or sudden forward and backward whipping movement of the head and neck (whiplash injury).  Symptoms may develop right away after injury, or they may develop over a few days.  This condition is treated by resting and icing the injured area and doing physical therapy exercises. This information is not intended to replace advice given to you by your health care provider. Make sure you discuss any questions you have with your health care provider. Document Released: 05/15/2007 Document Revised: 03/16/2016 Document Reviewed: 03/16/2016 Elsevier Interactive Patient Education  2019 [employer]. Muscle Strain A muscle strain is an injury that happens when a  muscle is stretched longer than normal. This can happen during a fall, sports, or lifting. This can tear some muscle fibers. Usually, recovery from muscle strain takes 1-2 weeks. Complete healing normally takes 5-6 weeks. This condition is first treated with PRICE therapy. This involves:  Protecting your muscle from being injured again.  Resting your injured muscle.  Icing your injured muscle.  Applying pressure (compression) to your injured muscle. This may be done with a splint or elastic bandage.  Raising (elevating) your injured muscle. Your doctor may also recommend medicine for pain. Follow these instructions at home: If you have a splint:  Wear the splint as told by your doctor. Take it off only as told by your doctor.  Loosen the splint if your fingers or toes tingle, get numb, or turn cold and blue.  Keep the splint clean.  If the splint is not waterproof: ? Do not let it get wet. ? Cover it with a watertight covering when you take a bath or a shower. Managing pain, stiffness, and swelling   If directed, put ice on your injured area. ? If you have a removable splint, take it off as told by your doctor. ? Put ice in a plastic bag. ? Place a towel between your skin and the bag. ? Leave the ice on for 20 minutes, 2-3 times a day.  Move your fingers or toes often. This helps to avoid stiffness and lessen swelling.  Raise your injured area above the level of your heart while you are sitting or lying down.  Wear an elastic bandage as told by your doctor. Make sure it is not too tight. General instructions  Take over-the-counter and prescription medicines only as told by your doctor.  Limit your activity. Rest your injured muscle as told by your doctor. Your doctor may say that gentle movements are okay.  If physical therapy was prescribed, do exercises as told by your doctor.  Do not put pressure on any part of the splint until it is fully hardened. This may take  many hours.  Do not use any products that contain nicotine or tobacco, such as cigarettes and e-cigarettes. These can delay bone healing. If you need help quitting, ask your doctor.  Warm up before you exercise. This helps to prevent more muscle strains.  Ask your doctor when it is safe to drive if you have a splint.  Keep all follow-up visits as told by your doctor. This is important. Contact a doctor if:  You have more pain or swelling in your injured area. Get help right away if:  You have any of these problems in your injured area: ? You have numbness. ? You have tingling. ? You lose a lot of strength. Summary  A muscle strain is an injury that happens when a muscle is stretched longer than normal.  This condition is first treated with PRICE therapy. This includes protecting, resting, icing, adding pressure, and raising your injury.  Limit your activity. Rest your injured muscle as told by your doctor. Your doctor may say that gentle movements are okay.  Warm up before you exercise. This helps to prevent more muscle strains. This information is not intended to replace advice given to you by your health care provider. Make sure you discuss any questions you have with your health care provider. Document Released: 04/26/2008 Document Revised: 08/24/2016 Document Reviewed: 08/24/2016 Elsevier Interactive Patient Education  2019 [employer] Trapezius Squeeze: Easy Positions    1.Middle: Head on ball, elbow bent, arm resting on ball, thumb UP, squeeze shoulder blade in, lift arm, rounding ball. 2.Lower: Head on ball, arm over head, resting on ball, thumb UP, squeeze shoulder blade in, lift arm, rounding ball. Hold ___ seconds. Repeat ___ times. Lift right arm. Do ___ sessions per day.  Copyright  VHI. All rights reserved.  Ibuprofen tablets and capsules What is this medicine? IBUPROFEN (eye BYOO proe fen) is a non-steroidal anti-inflammatory drug (NSAID). It is used for  dental pain, fever, headaches or migraines, osteoarthritis, rheumatoid arthritis, or painful monthly periods. It can also relieve minor aches and pains caused by a cold, flu, or sore throat. This medicine may be used for other purposes; ask your health care provider or pharmacist if you have questions. COMMON BRAND NAME(S): Advil, Advil Junior Strength, Advil Migraine, Genpril, Ibren, IBU, Midol, Midol Cramps and Body Aches, Motrin, Motrin IB, Motrin Junior Strength, Motrin Migraine Pain, Samson-8, Toxicology Saliva Collection What should I tell my health care provider before I take this medicine? They need to know if you have any of these conditions: -cigarette smoker -coronary artery bypass graft (CABG) surgery within the past 2 weeks -drink more than 3 alcohol-containing drinks a day -heart disease -high blood pressure -history of stomach bleeding -kidney disease -liver disease -lung or breathing disease, like asthma -an unusual or allergic reaction to ibuprofen, aspirin, other NSAIDs, other medicines, foods, dyes, or preservatives -pregnant or trying to get pregnant -breast-feeding How should I use this medicine? Take this medicine by mouth with a glass of water. Follow the directions on the prescription label. Take this medicine with food if your stomach gets upset. Try to not lie down for at least 10 minutes after you take the medicine. Take your medicine at regular intervals. Do not take your medicine more often than directed. A special MedGuide will be given to you by the pharmacist with each prescription and refill. Be sure to read this information carefully each time. Talk to your pediatrician regarding the use of this medicine in children. Special care may be needed. Overdosage: If you think you have taken too much of this medicine contact a poison control center or emergency room at once. NOTE: This medicine is only for you. Do not share this medicine with others. What if I miss a  dose? If you miss a dose, take it as soon as you can. If it is almost time for your next dose, take only that dose. Do not take double or extra doses. What  may interact with this medicine? Do not take this medicine with any of the following medications: -cidofovir -ketorolac -methotrexate -pemetrexed This medicine may also interact with the following medications: -alcohol -aspirin -diuretics -lithium -other drugs for inflammation like prednisone -warfarin This list may not describe all possible interactions. Give your health care provider a list of all the medicines, herbs, non-prescription drugs, or dietary supplements you use. Also tell them if you smoke, drink alcohol, or use illegal drugs. Some items may interact with your medicine. What should I watch for while using this medicine? Tell your doctor or healthcare professional if your symptoms do not start to get better or if they get worse. This medicine does not prevent heart attack or stroke. In fact, this medicine may increase the chance of a heart attack or stroke. The chance may increase with longer use of this medicine and in people who have heart disease. If you take aspirin to prevent heart attack or stroke, talk with your doctor or health care professional. Do not take other medicines that contain aspirin, ibuprofen, or naproxen with this medicine. Side effects such as stomach upset, nausea, or ulcers may be more likely to occur. Many medicines available without a prescription should not be taken with this medicine. This medicine can cause ulcers and bleeding in the stomach and intestines at any time during treatment. Ulcers and bleeding can happen without warning symptoms and can cause death. To reduce your risk, do not smoke cigarettes or drink alcohol while you are taking this medicine. You may get drowsy or dizzy. Do not drive, use machinery, or do anything that needs mental alertness until you know how this medicine affects  you. Do not stand or sit up quickly, especially if you are an older patient. This reduces the risk of dizzy or fainting spells. This medicine can cause you to bleed more easily. Try to avoid damage to your teeth and gums when you brush or floss your teeth. This medicine may be used to treat migraines. If you take migraine medicines for 10 or more days a month, your migraines may get worse. Keep a diary of headache days and medicine use. Contact your healthcare professional if your migraine attacks occur more frequently. What side effects may I notice from receiving this medicine? Side effects that you should report to your doctor or health care professional as soon as possible: -allergic reactions like skin rash, itching or hives, swelling of the face, lips, or tongue -severe stomach pain -signs and symptoms of bleeding such as bloody or black, tarry stools; red or dark-brown urine; spitting up blood or brown material that looks like coffee grounds; red spots on the skin; unusual bruising or bleeding from the eye, gums, or nose -signs and symptoms of a blood clot such as changes in vision; chest pain; severe, sudden headache; trouble speaking; sudden numbness or weakness of the face, arm, or leg -unexplained weight gain or swelling -unusually weak or tired -yellowing of eyes or skin Side effects that usually do not require medical attention (report to your doctor or health care professional if they continue or are bothersome): -bruising -diarrhea -dizziness, drowsiness -headache -nausea, vomiting This list may not describe all possible side effects. Call your doctor for medical advice about side effects. You may report side effects to FDA at 1-800-FDA-1088. Where should I keep my medicine? Keep out of the reach of children. Store at room temperature between 15 and 30 degrees C (59 and 86 degrees F). Keep container tightly closed.  Throw away any unused medicine after the expiration date. NOTE:  This sheet is a summary. It may not cover all possible information. If you have questions about this medicine, talk to your doctor, pharmacist, or health care provider.  2019 Elsevier/Gold Standard (2017-03-22 12:43:57)
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
## 2019-01-08 ENCOUNTER — Encounter: Payer: Self-pay | Admitting: Adult Health

## 2019-01-08 ENCOUNTER — Other Ambulatory Visit: Payer: Self-pay

## 2019-01-08 ENCOUNTER — Ambulatory Visit
Admission: RE | Admit: 2019-01-08 | Discharge: 2019-01-08 | Disposition: A | Discharge status: Home / Self Care | Payer: Managed Care, Other (non HMO) | Source: Ambulatory Visit | Attending: Adult Health | Admitting: Adult Health

## 2019-01-08 ENCOUNTER — Ambulatory Visit
Admission: RE | Admit: 2019-01-08 | Discharge: 2019-01-08 | Disposition: A | Discharge status: Home / Self Care | Payer: Managed Care, Other (non HMO) | Attending: Adult Health | Admitting: Adult Health

## 2019-01-08 DIAGNOSIS — M542 Cervicalgia: Secondary | ICD-10-CM

## 2019-01-08 NOTE — Progress Notes (Signed)
Patient ID: Alexandria Anderson, female   DOB: Oct 16, 1965, 53 y.o.   MRN: [id number] 01/08/19 See My chart message to patient below, no answer when called.  Hi [name],  I received your x ray results, I also left you a message in your voicemail to call the office if needed.  Your x ray showed :Mild disc space narrowing at C7-T1. Other disc spaces appear unremarkable. No appreciable facet hypertrophy. No fracture or Spondylolisthesis. I recommend that you follow up with [facility] in [place], they have a walk in clinic from 1pm to 7pm, Monday to Friday or you can call [phone] to schedule an appointment, I do advise that you are evaluated by them within the next 1- 2 weeks for follow up.   Orders Placed This Encounter  Procedures  . DG Cervical Spine Complete    Standing Status:   Future    Number of Occurrences:   1    Standing Expiration Date:   01/31/2019    Order Specific Question:   Reason for Exam (SYMPTOM  OR DIAGNOSIS REQUIRED)    Answer:   pain in neck/ shoulders/ cx 4 to 5 years. seems muscular rule out other etiology.    Order Specific Question:   Is patient pregnant?    Answer:   No    Order Specific Question:   Preferred imaging location?    Answer:   [facility]    Order Specific Question:   Call Results- Best Contact Number?    Answer:   [phone]    Order Specific Question:   Radiology Contrast Protocol - do NOT remove file path    Answer:   [email or web address]  . AMB referral to orthopedics    Referral Priority:   Routine    Referral Type:   Consultation    Referral Reason:   Specialty Services Required    Referred to Provider:   Earnestine Leys, MD    Requested Specialty:   Orthopedic Surgery    Number of Visits Requested:   1  . Ambulatory referral to Physical Therapy    Referral Priority:   Routine    Referral Type:   Physical Medicine    Referral Reason:   Specialty Services Required    Referred to  Provider:   Christella Scheuermann, PT    Requested Specialty:   Physical Therapy    Number of Visits Requested:   1  . POCT Urinalysis Dipstick (CPT 81002)   I will also send a referral to [facility], if you have any pain running down your arms or numbness I suggest you discontinue physical therapy and immediately follow up with orthopedics.   Please let me know should you have any questions you can call the office or message me here.  Thanks, Laverna Peace MSN, AGNP-C, FNP-C

## 2019-01-08 NOTE — Addendum Note (Signed)
Addended by: Doreen Beam on: 01/08/2019 12:37 PM   Modules accepted: Orders

## 2019-01-22 ENCOUNTER — Other Ambulatory Visit: Payer: Self-pay | Admitting: Internal Medicine

## 2019-01-22 DIAGNOSIS — Z1231 Encounter for screening mammogram for malignant neoplasm of breast: Secondary | ICD-10-CM

## 2019-03-01 ENCOUNTER — Ambulatory Visit
Admission: RE | Admit: 2019-03-01 | Discharge: 2019-03-01 | Disposition: A | Discharge status: Home / Self Care | Payer: Managed Care, Other (non HMO) | Source: Ambulatory Visit | Attending: Internal Medicine | Admitting: Internal Medicine

## 2019-03-01 DIAGNOSIS — Z1231 Encounter for screening mammogram for malignant neoplasm of breast: Secondary | ICD-10-CM | POA: Diagnosis not present

## 2019-04-03 ENCOUNTER — Other Ambulatory Visit: Payer: Self-pay

## 2019-04-03 ENCOUNTER — Other Ambulatory Visit: Payer: Managed Care, Other (non HMO)

## 2019-04-03 DIAGNOSIS — Z13220 Encounter for screening for lipoid disorders: Secondary | ICD-10-CM | POA: Diagnosis not present

## 2019-04-03 DIAGNOSIS — R5383 Other fatigue: Secondary | ICD-10-CM

## 2019-04-03 DIAGNOSIS — Z131 Encounter for screening for diabetes mellitus: Secondary | ICD-10-CM

## 2019-04-03 DIAGNOSIS — Z130 Encounter for screening for diseases of the blood and blood-forming organs and certain disorders involving the immune mechanism: Secondary | ICD-10-CM | POA: Diagnosis not present

## 2019-04-04 LAB — CBC WITH DIFFERENTIAL/PLATELET
Basophils Absolute: 0 10*3/uL (ref 0.0–0.2)
Basos: 0 %
EOS (ABSOLUTE): 0.1 10*3/uL (ref 0.0–0.4)
Eos: 1 %
Hematocrit: 38.2 % (ref 34.0–46.6)
Hemoglobin: 12.7 g/dL (ref 11.1–15.9)
Immature Grans (Abs): 0 10*3/uL (ref 0.0–0.1)
Immature Granulocytes: 0 %
Lymphocytes Absolute: 2.2 10*3/uL (ref 0.7–3.1)
Lymphs: 37 %
MCH: 28.1 pg (ref 26.6–33.0)
MCHC: 33.2 g/dL (ref 31.5–35.7)
MCV: 85 fL (ref 79–97)
Monocytes Absolute: 0.5 10*3/uL (ref 0.1–0.9)
Monocytes: 8 %
Neutrophils Absolute: 3.1 10*3/uL (ref 1.4–7.0)
Neutrophils: 54 %
Platelets: 224 10*3/uL (ref 150–450)
RBC: 4.52 x10E6/uL (ref 3.77–5.28)
RDW: 13.6 % (ref 11.7–15.4)
WBC: 5.9 10*3/uL (ref 3.4–10.8)

## 2019-04-04 LAB — COMPREHENSIVE METABOLIC PANEL
ALT: 17 IU/L (ref 0–32)
AST: 18 IU/L (ref 0–40)
Albumin/Globulin Ratio: 1.7 (ref 1.2–2.2)
Albumin: 4.7 g/dL (ref 3.8–4.8)
Alkaline Phosphatase: 66 IU/L (ref 39–117)
BUN/Creatinine Ratio: 16 (ref 9–23)
BUN: 11 mg/dL (ref 6–24)
Bilirubin Total: 0.2 mg/dL (ref 0.0–1.2)
CO2: 23 mmol/L (ref 20–29)
Calcium: 9.2 mg/dL (ref 8.7–10.2)
Chloride: 102 mmol/L (ref 96–106)
Creatinine, Ser: 0.67 mg/dL (ref 0.57–1.00)
GFR calc Af Amer: 119 mL/min/{1.73_m2} (ref >59)
GFR calc non Af Amer: 103 mL/min/{1.73_m2} (ref >59)
Globulin, Total: 2.7 g/dL (ref 1.5–4.5)
Glucose: 85 mg/dL (ref 65–99)
Potassium: 4.4 mmol/L (ref 3.5–5.2)
Sodium: 138 mmol/L (ref 134–144)
Total Protein: 7.4 g/dL (ref 6.0–8.5)

## 2019-04-04 LAB — LIPID PANEL
Chol/HDL Ratio: 3.1 ratio (ref 0.0–4.4)
Cholesterol, Total: 164 mg/dL (ref 100–199)
HDL: 53 mg/dL (ref >39)
LDL Chol Calc (NIH): 97 mg/dL (ref 0–99)
Triglycerides: 75 mg/dL (ref 0–149)
VLDL Cholesterol Cal: 14 mg/dL (ref 5–40)

## 2019-04-04 LAB — TSH: TSH: 1.44 u[IU]/mL (ref 0.450–4.500)

## 2019-04-04 LAB — VITAMIN B12: Vitamin B-12: 452 pg/mL (ref 232–1245)

## 2019-04-04 LAB — HGB A1C W/O EAG: Hgb A1c MFr Bld: 5.8 % — ABNORMAL HIGH (ref 4.8–5.6)

## 2019-04-04 LAB — VITAMIN D 25 HYDROXY (VIT D DEFICIENCY, FRACTURES): Vit D, 25-Hydroxy: 12.6 ng/mL — ABNORMAL LOW (ref 30.0–100.0)

## 2019-05-21 ENCOUNTER — Other Ambulatory Visit: Payer: Self-pay

## 2019-05-21 ENCOUNTER — Encounter: Payer: Self-pay | Admitting: Obstetrics and Gynecology

## 2019-05-21 ENCOUNTER — Ambulatory Visit (INDEPENDENT_AMBULATORY_CARE_PROVIDER_SITE_OTHER): Payer: Managed Care, Other (non HMO) | Admitting: Obstetrics and Gynecology

## 2019-05-21 VITALS — BP 120/80 | Ht 65.0 in | Wt 151.0 lb

## 2019-05-21 DIAGNOSIS — B373 Candidiasis of vulva and vagina: Secondary | ICD-10-CM | POA: Diagnosis not present

## 2019-05-21 DIAGNOSIS — N760 Acute vaginitis: Secondary | ICD-10-CM

## 2019-05-21 DIAGNOSIS — N761 Subacute and chronic vaginitis: Secondary | ICD-10-CM

## 2019-05-21 DIAGNOSIS — B3731 Acute candidiasis of vulva and vagina: Secondary | ICD-10-CM

## 2019-05-21 NOTE — Progress Notes (Signed)
Patient ID: Alexandria Anderson, female   DOB: [birth year], 53 y.o.   MRN: [id number]  Reason for Consult: Vaginitis   Referred by Perrin Maltese, MD  Subjective:     HPI:  Alexandria Anderson is a 53 y.o. female. She presents today for complaints of vaginal discharge. She has been treated 5 times this year for bacterial vaginosis by her PCP. She has been taking a weekly diflucan for suppression of recurrent yeast infections. She is on estrogen replacement therapy for vasomotor symptoms. Her dosage was increased in March and she has been happy on her new dose.   She denies itching and burning. She describes discharge as thin and watery with an odor.  Past Medical History:  Diagnosis Date  . Allergy   . Anemia   . Asthma    as a child  . GERD (gastroesophageal reflux disease)   . PONV (postoperative nausea and vomiting)    after first foot surgery  . Vitamin D deficiency    Family History  Problem Relation Age of Onset  . Hypertension Mother   . Hypercholesterolemia Mother   . Hyperlipidemia Mother   . Heart disease Mother   . Alcohol abuse Father   . Cirrhosis Father   . Breast cancer Neg Hx    Past Surgical History:  Procedure Laterality Date  . ABDOMINAL HYSTERECTOMY    . COLONOSCOPY WITH PROPOFOL N/A 10/07/2016   Procedure: COLONOSCOPY WITH PROPOFOL;  Surgeon: Lucilla Lame, MD;  Location: [place];  Service: Endoscopy;  Laterality: N/A;  . CYSTOSCOPY N/A 02/03/2015   Procedure: CYSTOSCOPY;  Surgeon: Will Bonnet, MD;  Location: [facility];  Service: Gynecology;  Laterality: N/A;  . HAMMER TOE SURGERY Bilateral   . LAPAROSCOPIC HYSTERECTOMY N/A 02/03/2015   Procedure: HYSTERECTOMY TOTAL LAPAROSCOPIC/BILATERAL SALPINGECTOMY;  Surgeon: Will Bonnet, MD;  Location: [facility];  Service: Gynecology;  Laterality: N/A;    Short Social History:  Social History   Tobacco Use  . Smoking status: Never Smoker  . Smokeless tobacco: Never Used  Substance Use Topics  .  Alcohol use: Yes    Alcohol/week: 2.0 standard drinks    Types: 2 Glasses of wine per week    Comment:      Allergies  Allergen Reactions  . Prednisone Palpitations    Current Outpatient Medications  Medication Sig Dispense Refill  . estradiol (VIVELLE-DOT) 0.075 MG/24HR Place 1 patch onto the skin 2 (two) times a week. 24 patch 4  . omeprazole (PRILOSEC) 20 MG capsule Take 20 mg by mouth daily.  12  . SOOLANTRA 1 % CREA Apply to face once daily    . albuterol (PROVENTIL HFA;VENTOLIN HFA) 108 (90 Base) MCG/ACT inhaler     . baclofen (LIORESAL) 10 MG tablet Take 1 tablet (10 mg total) by mouth 3 (three) times daily as needed for muscle spasms. For muscle spasms (Patient not taking: Reported on 05/21/2019) 90 tablet 0  . cetirizine (ZYRTEC) 10 MG tablet Take 1 tablet (10 mg total) by mouth daily. 30 tablet 11  . fluticasone (FLONASE) 50 MCG/ACT nasal spray 1-2 sprays into each nostril daily 16 g 0   No current facility-administered medications for this visit.     Review of Systems  Constitutional: Negative for chills, fatigue, fever and unexpected weight change.  HENT: Negative for trouble swallowing.  Eyes: Negative for loss of vision.  Respiratory: Negative for cough, shortness of breath and wheezing.  Cardiovascular: Negative for chest pain, leg swelling, palpitations  and syncope.  GI: Negative for abdominal pain, blood in stool, diarrhea, nausea and vomiting.  GU: Negative for difficulty urinating, dysuria, frequency and hematuria.  Musculoskeletal: Negative for back pain, leg pain and joint pain.  Skin: Negative for rash.  Neurological: Negative for dizziness, headaches, light-headedness, numbness and seizures.  Psychiatric: Negative for behavioral problem, confusion, depressed mood and sleep disturbance.        Objective:  Objective   Vitals:   05/21/19 1638  BP: 120/80  Weight: 151 lb (68.5 kg)  Height: 5\' 5"  (1.651 m)   Body mass index is 25.13 kg/m.  Physical  Exam Vitals signs and nursing note reviewed.  Constitutional:      Appearance: She is well-developed.  HENT:     Head: Normocephalic and atraumatic.  Eyes:     Pupils: Pupils are equal, round, and reactive to light.  Cardiovascular:     Rate and Rhythm: Normal rate and regular rhythm.  Pulmonary:     Effort: Pulmonary effort is normal. No respiratory distress.  Genitourinary:    Comments: External: Vulva normal. no lesions noted.  Speculum examination:  Copious white thickvaginal discharge present in the vaginal vault.  Skin:    General: Skin is warm and dry.  Neurological:     Mental Status: She is alert and oriented to person, place, and time.  Psychiatric:        Behavior: Behavior normal.        Thought Content: Thought content normal.        Judgment: Judgment normal.    Wet Prep: Clue Cells: Negative Fungal elements: Positive Trichomonas: Negative      Assessment/Plan:     53 yo with recurrent vaginitis.  Copious thick white discharge on exam Yeast on wet mount Suspicious for fluconazole resistance MDL lab sent to test for this Will follow up in 2 weeks to further plan treatment  More than 15 minutes were spent face to face with the patient in the room with more than 50% of the time spent providing counseling and discussing the plan of management.    Adaline Prows MD [facility], [facility] 05/21/2019 5:40 PM

## 2019-05-23 ENCOUNTER — Encounter: Payer: Self-pay | Admitting: Obstetrics and Gynecology

## 2019-05-28 ENCOUNTER — Encounter: Payer: Self-pay | Admitting: Obstetrics and Gynecology

## 2019-06-05 ENCOUNTER — Ambulatory Visit: Payer: Managed Care, Other (non HMO) | Admitting: Obstetrics and Gynecology

## 2019-10-02 ENCOUNTER — Encounter: Payer: Self-pay | Admitting: Obstetrics and Gynecology

## 2019-10-02 ENCOUNTER — Ambulatory Visit (INDEPENDENT_AMBULATORY_CARE_PROVIDER_SITE_OTHER): Payer: Managed Care, Other (non HMO) | Admitting: Obstetrics and Gynecology

## 2019-10-02 ENCOUNTER — Other Ambulatory Visit: Payer: Self-pay

## 2019-10-02 VITALS — BP 110/64 | Ht 65.0 in | Wt 150.0 lb

## 2019-10-02 DIAGNOSIS — B9689 Other specified bacterial agents as the cause of diseases classified elsewhere: Secondary | ICD-10-CM

## 2019-10-02 DIAGNOSIS — Z0000 Encounter for general adult medical examination without abnormal findings: Secondary | ICD-10-CM

## 2019-10-02 DIAGNOSIS — N760 Acute vaginitis: Secondary | ICD-10-CM | POA: Diagnosis not present

## 2019-10-02 DIAGNOSIS — Z5181 Encounter for therapeutic drug level monitoring: Secondary | ICD-10-CM | POA: Diagnosis not present

## 2019-10-02 DIAGNOSIS — Z90710 Acquired absence of both cervix and uterus: Secondary | ICD-10-CM

## 2019-10-02 DIAGNOSIS — Z1231 Encounter for screening mammogram for malignant neoplasm of breast: Secondary | ICD-10-CM

## 2019-10-02 DIAGNOSIS — Z79890 Hormone replacement therapy (postmenopausal): Secondary | ICD-10-CM

## 2019-10-02 DIAGNOSIS — A499 Bacterial infection, unspecified: Secondary | ICD-10-CM

## 2019-10-02 MED ORDER — ESTRADIOL 0.075 MG/24HR TD PTTW: 1.0000 | MEDICATED_PATCH | TRANSDERMAL | 4 refills | Status: DC

## 2019-10-02 MED ORDER — BORIC ACID CRYS: 600.0000 mg | CRYSTALS | 1 refills | Status: AC

## 2019-10-02 MED ORDER — METRONIDAZOLE 0.75 % VA GEL: 1.0000 | VAGINAL | 3 refills | Status: DC

## 2019-10-02 NOTE — Patient Instructions (Addendum)
Approximately 30 percent of patients with an initial response to therapy have a recurrence of symptoms within three months and more than 50 percent experience a recurrence within 12 months. We treat symptomatic relapse with a longer course of therapy, using a different antibiotic than that used for the initial episode. (See 'Relapse and recurrent infection' above.) .For women who prefer preventive therapy instead of treatment of frequent episodes of BV, we suggest oral metronidazole 500 mg twice daily or oral tinidazole 1 gram once daily for seven days; vaginal boric acid 600 mg is begun at the same time and continued for 21 days (Grade 2B). Boric acid can cause death if consumed orally. (See 'Relapse and recurrent infection' above.) .Patients are seen for follow-up a day or two after their last vaginal boric acid dose; if they are in remission, we immediately begin metronidazole gel twice weekly for four to six months as suppressive therapy. (See 'Relapse and recurrent infection' above.)    [place] for Calcium and Vitamin D  Age (yr) Calcium Recommended Dietary Allowance (mg/day) Vitamin D Recommended Dietary Allowance (international units/day)  9-18 1,300 600  19-50 1,000 600  51-70 1,200 600  71 and older 1,200 800  Data from Institute of Medicine. Dietary reference intakes: calcium, vitamin D. [place], [place]: [employer]; 2011.     Exercising to Stay Healthy To become healthy and stay healthy, it is recommended that you do moderate-intensity and vigorous-intensity exercise. You can tell that you are exercising at a moderate intensity if your heart starts beating faster and you start breathing faster but can still hold a conversation. You can tell that you are exercising at a vigorous intensity if you are breathing much harder and faster and cannot hold a conversation while exercising. Exercising  regularly is important. It has many health benefits, such as:  Improving overall fitness, flexibility, and endurance.  Increasing bone density.  Helping with weight control.  Decreasing body fat.  Increasing muscle strength.  Reducing stress and tension.  Improving overall health. How often should I exercise? Choose an activity that you enjoy, and set realistic goals. Your health care provider can help you make an activity plan that works for you. Exercise regularly as told by your health care provider. This may include:  Doing strength training two times a week, such as: ? Lifting weights. ? Using resistance bands. ? Push-ups. ? Sit-ups. ? Yoga.  Doing a certain intensity of exercise for a given amount of time. Choose from these options: ? A total of 150 minutes of moderate-intensity exercise every week. ? A total of 75 minutes of vigorous-intensity exercise every week. ? A mix of moderate-intensity and vigorous-intensity exercise every week. Children, pregnant women, people who have not exercised regularly, people who are overweight, and older adults may need to talk with a health care provider about what activities are safe to do. If you have a medical condition, be sure to talk with your health care provider before you start a new exercise program. What are some exercise ideas? Moderate-intensity exercise ideas include:  Walking 1 mile (1.6 km) in about 15 minutes.  Biking.  Hiking.  Golfing.  Dancing.  Water aerobics. Vigorous-intensity exercise ideas include:  Walking 4.5 miles (7.2 km) or more in about 1 hour.  Jogging or running 5 miles (8 km) in about 1 hour.  Biking 10 miles (16.1 km) or more in  about 1 hour.  Lap swimming.  Roller-skating or in-line skating.  Cross-country skiing.  Vigorous competitive sports, such as football, basketball, and soccer.  Jumping rope.  Aerobic dancing. What are some everyday activities that can help me to get  exercise?  [place] work, such as: ? Pushing a Conservation officer, nature. ? Raking and bagging leaves.  Washing your car.  Pushing a stroller.  Shoveling snow.  Gardening.  Washing windows or floors. How can I be more active in my day-to-day activities?  Use stairs instead of an elevator.  Take a walk during your lunch break.  If you drive, park your car farther away from your work or school.  If you take public transportation, get off one stop early and walk the rest of the way.  Stand up or walk around during all of your indoor phone calls.  Get up, stretch, and walk around every 30 minutes throughout the day.  Enjoy exercise with a friend. Support to continue exercising will help you keep a regular routine of activity. What guidelines can I follow while exercising?  Before you start a new exercise program, talk with your health care provider.  Do not exercise so much that you hurt yourself, feel dizzy, or get very short of breath.  Wear comfortable clothes and wear shoes with good support.  Drink plenty of water while you exercise to prevent dehydration or heat stroke.  Work out until your breathing and your heartbeat get faster. Where to find more information  U.S. Department of Health and Human Services: [employer]at  Centers for Disease Control and Prevention (CDC): http://www.wolf.info/ Summary  Exercising regularly is important. It will improve your overall fitness, flexibility, and endurance.  Regular exercise also will improve your overall health. It can help you control your weight, reduce stress, and improve your bone density.  Do not exercise so much that you hurt yourself, feel dizzy, or get very short of breath.  Before you start a new exercise program, talk with your health care provider. This information is not intended to replace advice given to you by your health care provider. Make sure you discuss any questions you have with your health care provider. Document Revised:  06/30/2017 Document Reviewed: 06/08/2017 Elsevier Patient Education  [place]. Budget-Friendly Healthy Eating There are many ways to save money at the grocery store and continue to eat healthy. You can be successful if you:  Plan meals according to your budget.  Make a grocery list and only purchase food according to your grocery list.  Prepare food yourself. What are tips for following this plan?  Reading food labels  Compare food labels between brand name foods and the store brand. Often the nutritional value is the same, but the store brand is lower cost.  Look for products that do not have added sugar, fat, or salt (sodium). These often cost the same but are healthier for you. Products may be labeled as: ? Sugar-free. ? Nonfat. ? Low-fat. ? Sodium-free. ? Low-sodium.  Look for lean ground beef labeled as at least 92% lean and 8% fat. Shopping  Buy only the items on your grocery list and go only to the areas of the store that have the items on your list.  Use coupons only for foods and brands you normally buy. Avoid buying items you wouldn't normally buy simply because they are on sale.  Check online and in newspapers for weekly deals.  Buy healthy items from the bulk bins when available, such as  herbs, spices, flour, pasta, nuts, and dried fruit.  Buy fruits and vegetables that are in season. Prices are usually lower on in-season produce.  Look at the unit price on the price tag. Use it to compare different brands and sizes to find out which item is the best deal.  Choose healthy items that are often low-cost, such as carrots, potatoes, apples, bananas, and oranges. Dried or canned beans are a low-cost protein source.  Buy in bulk and freeze extra food. Items you can buy in bulk include meats, fish, poultry, frozen fruits, and frozen vegetables.  Avoid buying "ready-to-eat" foods, such as pre-cut fruits and vegetables and pre-made salads.  If possible, shop  around to discover where you can find the best prices. Consider other retailers such as dollar stores, larger [employer], local fruit and vegetable stands, and farmers markets.  Do not shop when you are hungry. If you shop while hungry, it may be hard to stick to your list and budget.  Resist impulse buying. Use your grocery list as your official plan for the week.  Buy a variety of vegetables and fruits by purchasing fresh, frozen, and canned items.  Look at the top and bottom shelves for deals. Foods at eye level (eye level of an adult or child) are usually more expensive.  Be efficient with your time when shopping. The more time you spend at the store, the more money you are likely to spend.  To save money when choosing more expensive foods like meats and dairy: ? Choose cheaper cuts of meat, such as bone-in chicken thighs and drumsticks instead of skinless and boneless chicken. When you are ready to prepare the chicken, you can remove the skin yourself to make it healthier. ? Choose lean meats like chicken or [place] instead of beef. ? Choose canned seafood, such as tuna, salmon, or sardines. ? Buy eggs as a low-cost source of protein. ? Buy dried beans and peas, such as lentils, split peas, or kidney beans instead of meats. Dried beans and peas are a good alternative source of protein. ? Buy the larger tubs of yogurt instead of individual-sized containers.  Choose water instead of sodas and other sweetened beverages.  Avoid buying chips, cookies, and other "junk food." These items are usually expensive and not healthy. Cooking  Make extra food and freeze the extras in meal-sized containers or in individual portions for fast meals and snacks.  Pre-cook on days when you have extra time to prepare meals in advance. You can keep these meals in the fridge or freezer and reheat for a quick meal.  When you come home from the grocery store, wash, peel, and cut fruits and vegetables so  they are ready to use and eat. This will help reduce food waste. Meal planning  Do not eat out or get fast food. Prepare food at home.  Make a grocery list and make sure to bring it with you to the store. If you have a smart phone, you could use your phone to create your shopping list.  Plan meals and snacks according to a grocery list and budget you create.  Use leftovers in your meal plan for the week.  Look for recipes where you can cook once and make enough food for two meals.  Include budget-friendly meals like stews, casseroles, and stir-fry dishes.  Try some meatless meals or try "no cook" meals like salads.  Make sure that half your plate is filled with fruits or vegetables. Choose  from fresh, frozen, or canned fruits and vegetables. If eating canned, remember to rinse them before eating. This will remove any excess salt added for packaging. Summary  Eating healthy on a budget is possible if you plan your meals according to your budget, purchase according to your budget and grocery list, and prepare food yourself.  Tips for buying more food on a limited budget include buying generic brands, using coupons only for foods you normally buy, and buying healthy items from the bulk bins when available.  Tips for buying cheaper food to replace expensive food include choosing cheaper, lean cuts of meat, and buying dried beans and peas. This information is not intended to replace advice given to you by your health care provider. Make sure you discuss any questions you have with your health care provider. Document Revised: 07/19/2017 Document Reviewed: 07/19/2017 Elsevier Patient Education  [place] are available regarding the optimal treatment strategy for women with recurrent BV [7]. We suggest symptomatic relapse be treated initially with a seven-day course of oral or vaginal metronidazole or clindamycin. Alternately, vaginal boric acid  suppositories for 30 days can used as an induction regimen or to follow seven-day oral treatment [7,108]. The treatment regimen may be the same or different from the initial or previous treatment regimen [7]. Boric acid can cause death if consumed orally; patients should be told to store boric acid in a secure place that is inaccessible to children. Boric acid should not be used by individuals who are pregnant or attempting conception. After initial induction therapy, most women with a history of recurrent infection benefit from suppressive therapy to maintain an asymptomatic state. We believe any patient with more than three documented episodes of BV in the previous 12 months should be offered a long-term maintenance regimen consisting of maintenance metronidazole vaginal gel. Long-term clindamycin regimens, oral or topical, are not advised because of toxicity (oral) and lack of documented efficacy (topical) [102].  .For patients with recurrent BV, we first prescribe metronidazole gel 0.75 percent or an oral nitroimidazole for 7 to 10 days followed by twice weekly dosing of gel for four to six months [7]. The choice of initial treatment (vaginal gel or oral pill) is based upon availability and patient preferences. In one multicenter prospective trial of this metronidazole gel regimen, recurrent BV occurred in 25.5 percent of patients on suppressive therapy versus 59.1 percent of those receiving placebo [106]. Secondary vaginal candidiasis was a common side effect.  .Intravaginal treatment with Lactobacillus crispatus is a promising therapy to follow the initial course of metronidazole. In a phase 2b trial comparing vaginal L. crispatus CTV-05 (LACTIN-V) with placebo in 228 women diagnosed with BV, patients receiving LACTIN-V had fewer BV recurrences at 12 and 24 weeks of follow-up (recurrence rate 12 weeks: 30 versus 45 percent; 24 weeks: 39 versus 54 percent) [109]. Treatment with LACTIN-V also resulted in  higher rates of detectable vaginal L. crispatus compared with placebo (12 weeks: 79 versus 6 percent; 24 weeks: 48 versus 2 percent). All patients initially received seven days of vaginal metronidazole followed by 11 weeks of either therapy or placebo. Adverse event rates were similar between the two groups; no serious adverse events were attributable to either therapy. This approach is promising both for eliminating BV and for restoring Lactobacillus colonization. However, limitations include that 30 percent of patients experienced recurrence during active treatment and outcomes were not studied beyond three months. In addition, the product is not commercially available,  and study results should not be extrapolated to other probiotic remedies.   Although supporting data are lacking, women with a metronidazole allergy can be treated with either topical clindamycin gel or undergo metronidazole desensitization. As noted above, topical clindamycin gel (2%) is less effective, which is why metronidazole gel is preferred. In our experience, clindamycin therapy is frequently associated with vaginal yeast coinfections compared with metronidazole gel. Clindamycin should not be used in women with a history of C. difficile infection.  Results can be improved by adding vaginal boric acid to the oral nitroimidazole induction therapy; vaginal boric acid alone is ineffective [108]. Metronidazole or tinidazole is taken orally for seven days and vaginal boric acid 600 mg once daily at bedtime is begun at the same time and continued for 21 days [7]. Patients are seen for follow-up a day or two after their last vaginal boric acid dose; if they are in remission, we immediately begin metronidazole gel twice weekly for four to six months as suppressive therapy. Therapy is then discontinued once treatment has been completed.

## 2019-10-02 NOTE — Progress Notes (Signed)
Gynecology Annual Exam  PCP: Perrin Maltese, MD  Chief Complaint:  Chief Complaint  Patient presents with  . Gynecologic Exam    Wants to talk about labs from last time    History of Present Illness:Patient is a 54 y.o. [id number] presents for annual exam. The patient has no complaints today.   LMP: Patient's last menstrual period was 12/30/2014 (approximate). Menarche:not applicable s/p hysterectomy- no vaginal bleeding   The patient is sexually active. She denies dyspareunia.  The patient does perform self breast exams.  There is no notable family history of breast or ovarian cancer in her family.   The patient has regular exercise: no.    The patient denies current symptoms of depression.     Review of Systems: ROS  Past Medical History:  Past Medical History:  Diagnosis Date  . Allergy   . Anemia   . Asthma    as a child  . GERD (gastroesophageal reflux disease)   . PONV (postoperative nausea and vomiting)    after first foot surgery  . Vitamin D deficiency     Past Surgical History:  Past Surgical History:  Procedure Laterality Date  . ABDOMINAL HYSTERECTOMY    . COLONOSCOPY WITH PROPOFOL N/A 10/07/2016   Procedure: COLONOSCOPY WITH PROPOFOL;  Surgeon: Lucilla Lame, MD;  Location: [facility];  Service: Endoscopy;  Laterality: N/A;  . CYSTOSCOPY N/A 02/03/2015   Procedure: CYSTOSCOPY;  Surgeon: Will Bonnet, MD;  Location: [facility];  Service: Gynecology;  Laterality: N/A;  . HAMMER TOE SURGERY Bilateral   . LAPAROSCOPIC HYSTERECTOMY N/A 02/03/2015   Procedure: HYSTERECTOMY TOTAL LAPAROSCOPIC/BILATERAL SALPINGECTOMY;  Surgeon: Will Bonnet, MD;  Location: [facility];  Service: Gynecology;  Laterality: N/A;    Gynecologic History:  Patient's last menstrual period was 12/30/2014 (approximate). Last Pap: s/p hysterectomy- deneis abnormal pap smears prior ti hysterectomy Last mammogram: 2020 Results were: BI-RAD I  Obstetric [id number]  Family History:  Family History  Problem Relation Age of Onset  . Hypertension Mother   . Hypercholesterolemia Mother   . Hyperlipidemia Mother   . Heart disease Mother   . Alcohol abuse Father   . Cirrhosis Father   . Breast cancer Neg Hx     Social History:  Social History   Socioeconomic History  . Marital status: Married    Spouse name: Not on file  . Number of children: Not on file  . Years of education: Not on file  . Highest education level: Not on file  Occupational History  . Not on file  Tobacco Use  . Smoking status: Never Smoker  . Smokeless tobacco: Never Used  Substance and Sexual Activity  . Alcohol use: Yes    Alcohol/week: 2.0 standard drinks    Types: 2 Glasses of wine per week    Comment:    . Drug use: No  . Sexual activity: Yes    Birth control/protection: Post-menopausal, Surgical  Other Topics Concern  . Not on file  Social History Narrative  . Not on file   Social Determinants of Health   Financial Resource Strain:   . Difficulty of Paying Living Expenses: Not on file  Food Insecurity:   . Worried About Charity fundraiser in the Last Year: Not on file  . Ran Out of Food in the Last Year: Not on file  Transportation Needs:   . Lack of Transportation (Medical): Not on file  . Lack of Transportation (Non-Medical):  Not on file  Physical Activity:   . Days of Exercise per Week: Not on file  . Minutes of Exercise per Session: Not on file  Stress:   . Feeling of Stress : Not on file  Social Connections:   . Frequency of Communication with Friends and Family: Not on file  . Frequency of Social Gatherings with Friends and Family: Not on file  . Attends Religious Services: Not on file  . Active Member of Clubs or Organizations: Not on file  . Attends Archivist Meetings: Not on file  . Marital Status: Not on file  Intimate Partner Violence:   . Fear of Current or Ex-Partner: Not on file  . Emotionally Abused: Not on  file  . Physically Abused: Not on file  . Sexually Abused: Not on file    Allergies:  Allergies  Allergen Reactions  . Prednisone Palpitations    Medications: Prior to Admission medications   Medication Sig Start Date End Date Taking? Authorizing Provider  baclofen (LIORESAL) 10 MG tablet Take 1 tablet (10 mg total) by mouth 3 (three) times daily as needed for muscle spasms. For muscle spasms 10/31/17  Yes McManama, Franchot Mimes, FNP  estradiol (VIVELLE-DOT) 0.075 MG/24HR Place 1 patch onto the skin 2 (two) times a week. 10/01/18  Yes Corley Maffeo R, MD  omeprazole (PRILOSEC) 20 MG capsule Take 20 mg by mouth daily. 11/09/16  Yes [provider]  SOLOSEC 2 g PACK MIX 1 PACKET IN APPLESAUCE, THEN EAT. FINISH WITHIN 15 MINUTES. 09/20/19  Yes [provider]  SOOLANTRA 1 % CREA Apply to face once daily 11/23/18  Yes [provider]    Physical Exam Vitals: Blood pressure 110/64, height 5\' 5"  (1.651 m), weight 150 lb (68 kg), last menstrual period 12/30/2014.  General: NAD HEENT: normocephalic, anicteric Thyroid: no enlargement, no palpable nodules Pulmonary: No increased work of breathing, CTAB Cardiovascular: RRR, distal pulses 2+ Breast: Breast symmetrical, no tenderness, no palpable nodules or masses, no skin or nipple retraction present, no nipple discharge.  No axillary or supraclavicular lymphadenopathy. Abdomen: NABS, soft, non-tender, non-distended.  Umbilicus without lesions.  No hepatomegaly, splenomegaly or masses palpable. No evidence of hernia  Genitourinary:  External: Normal external female genitalia.  Normal urethral meatus, normal Bartholin's and Skene's glands.    Vagina: Normal vaginal mucosa, no evidence of prolapse.    Cervix: surgically absent  Uterus: surgically absent  Adnexa: ovaries non-enlarged, no adnexal masses  Rectal: deferred  Lymphatic: no evidence of inguinal lymphadenopathy Extremities: no edema, erythema, or  tenderness Neurologic: Grossly intact Psychiatric: mood appropriate, affect full  Female chaperone present for pelvic and breast  portions of the physical exam     Assessment: 54 y.o. [id number] routine annual exam  Plan: Problem List Items Addressed This Visit    None    Visit Diagnoses    Healthcare maintenance    -  Primary   Screening mammogram, encounter for       Relevant Orders   MM 3D SCREEN BREAST BILATERAL   Encounter for monitoring postmenopausal estrogen replacement therapy       Relevant Medications   estradiol (VIVELLE-DOT) 0.075 MG/24HR (Start on 10/03/2019)   Recurrent bacterial infection       Relevant Medications   metroNIDAZOLE (METROGEL) 0.75 % vaginal gel (Start on 10/03/2019)   Boric Acid CRYS (Start on 10/03/2019)   BV (bacterial vaginosis)       Relevant Medications   metroNIDAZOLE (METROGEL) 0.75 % vaginal gel (  Start on 10/03/2019)   Boric Acid CRYS (Start on 10/03/2019)      1) Mammogram - recommend yearly screening mammogram.  Mammogram Was ordered today  2) STI screening  was not offered and therefore not obtained  3) ASCCP guidelines and rational discussed.  Patient opts for discontinue secondary to prior hysterectomy screening interval  4) Osteoporosis  - per USPTF routine screening DEXA at age 76 - FRAX 46 year major fracture risk 1.8,  10 year hip fracture risk 0.1  Consider FDA-approved medical therapies in postmenopausal women and men aged 65 years and older, based on the following: a) A hip or vertebral (clinical or morphometric) fracture b) T-score ? -2.5 at the femoral neck or spine after appropriate evaluation to exclude secondary causes C) Low bone mass (T-score between -1.0 and -2.5 at the femoral neck or spine) and a 10-year probability of a hip fracture ? 3% or a 10-year probability of a major osteoporosis-related fracture ? 20% based on the [place]-adapted WHO algorithm   5) Routine healthcare maintenance including cholesterol, diabetes  screening discussed managed by PCP  6) Colonoscopy in 2018.  Screening recommended starting at age 8 for average risk individuals, age 17 for individuals deemed at increased risk (including African Americans) and recommended to continue until age 85.  For patient age 14-85 individualized approach is recommended.  Gold standard screening is via colonoscopy, Cologuard screening is an acceptable alternative for patient unwilling or unable to undergo colonoscopy.  "Colorectal cancer screening for average?risk adults: 2018 guideline update from the American Cancer Society"[place]: A Cancer Journal for Clinicians: Dec 28, 2016   7) Recurrent bacterial vaginosis-reviewed [facility] labs with her which had shown microflora consistent with BV as well as enteric coccus.  Discussed treatment options for suppression with boric acid suppositories and MetroGel.  Patient is interested in this therapy and prescriptions were sent.  8) Rstrogen replacement therapy for vasomotor symptoms-has significantly improved patient's symptoms and she desires to continue therapy at this time.  Refill sent.  9) Return in about 1 year (around 10/01/2020) for annual.  Allisha Prows MD [place] 10/02/2019 5:11 PM

## 2019-12-01 IMAGING — MG DIGITAL SCREENING BILATERAL MAMMOGRAM WITH TOMO AND CAD
8 series · 8 of 24 positions shown · non-contrast
Comparison: Previous exam(s).

CLINICAL DATA: Screening.

EXAM:
DIGITAL SCREENING BILATERAL MAMMOGRAM WITH TOMO AND CAD

[R MLO synth-2D]
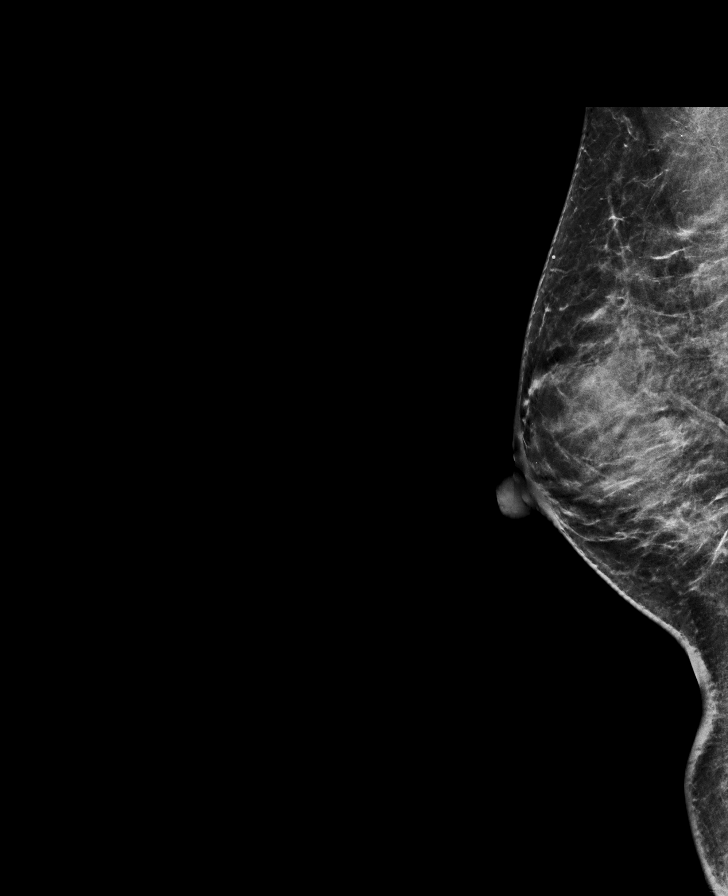

[R CC synth-2D]
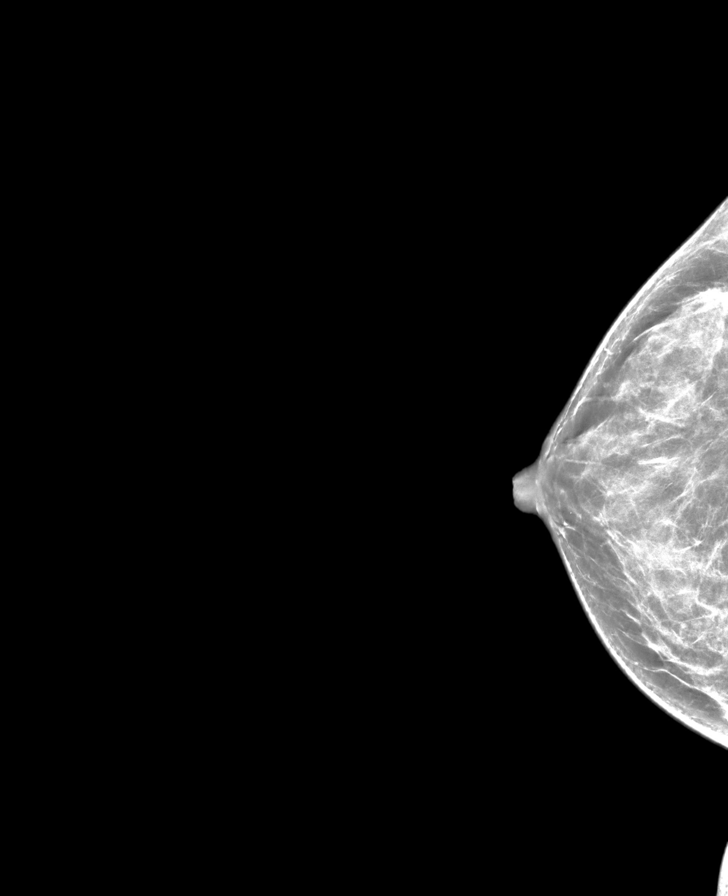

[L CC synth-2D]
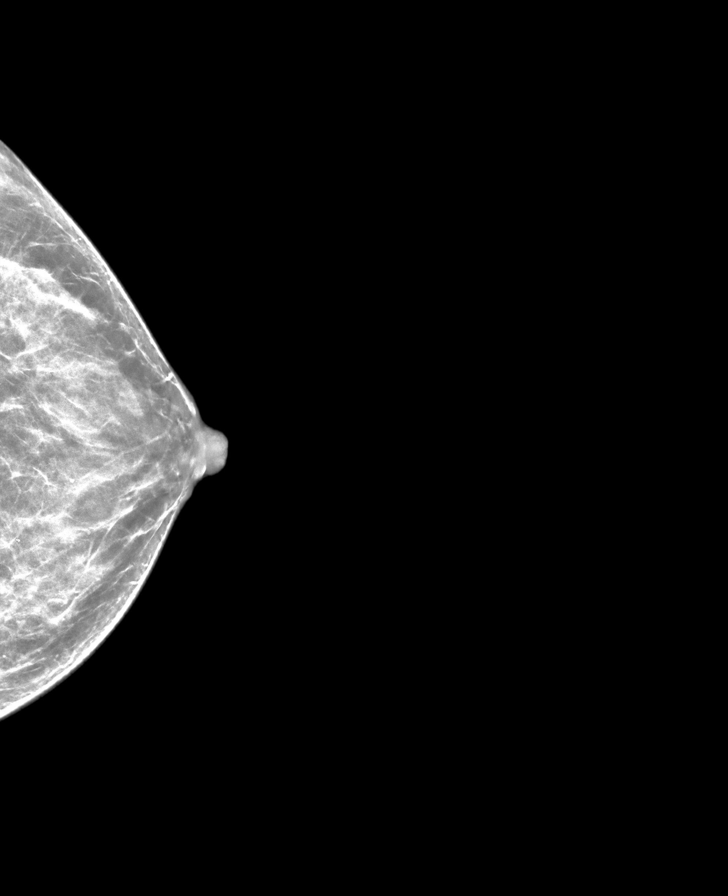

[L MLO synth-2D]
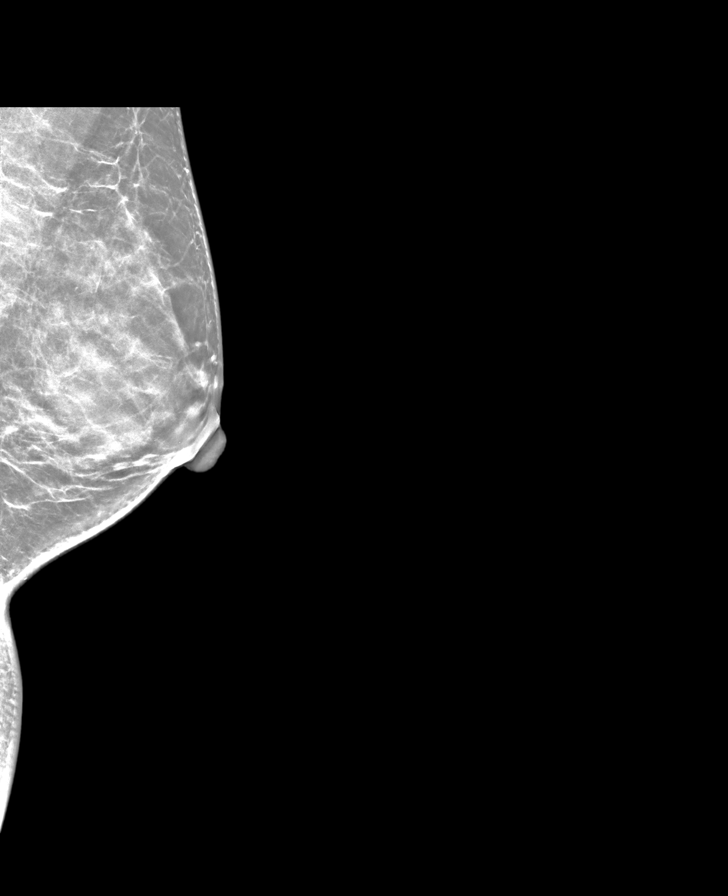

[R CC tomo · tomo slice 33/65.0]
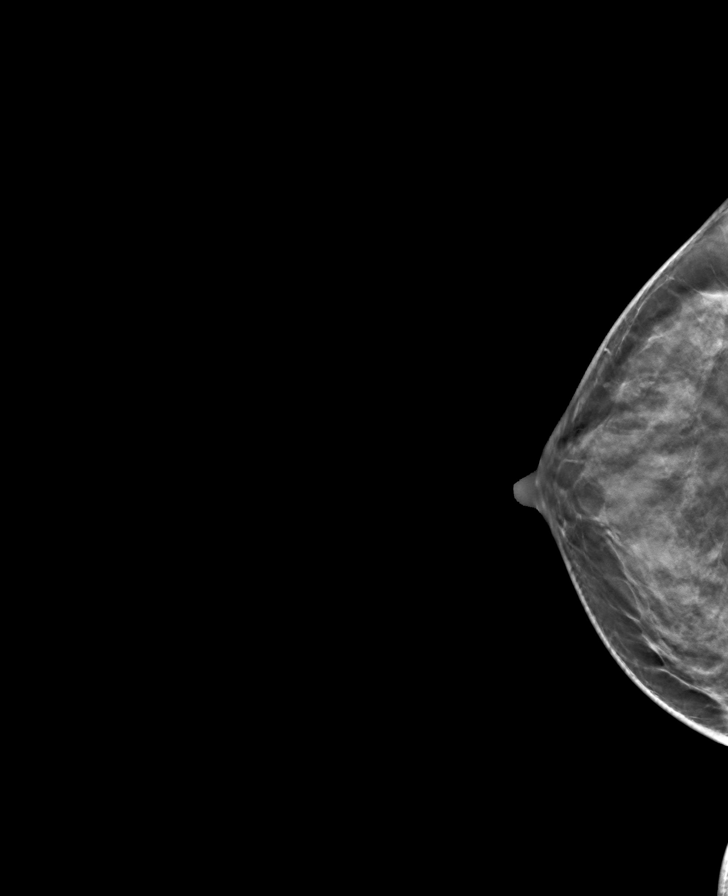

[R MLO tomo · tomo slice 33/65.0]
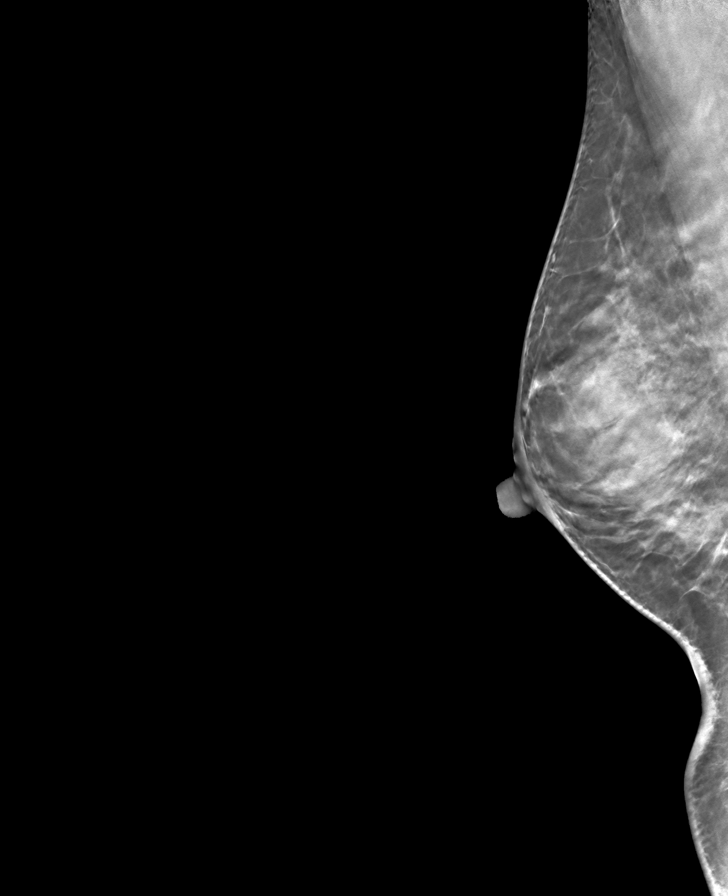

[L CC tomo · tomo slice 31/61.0]
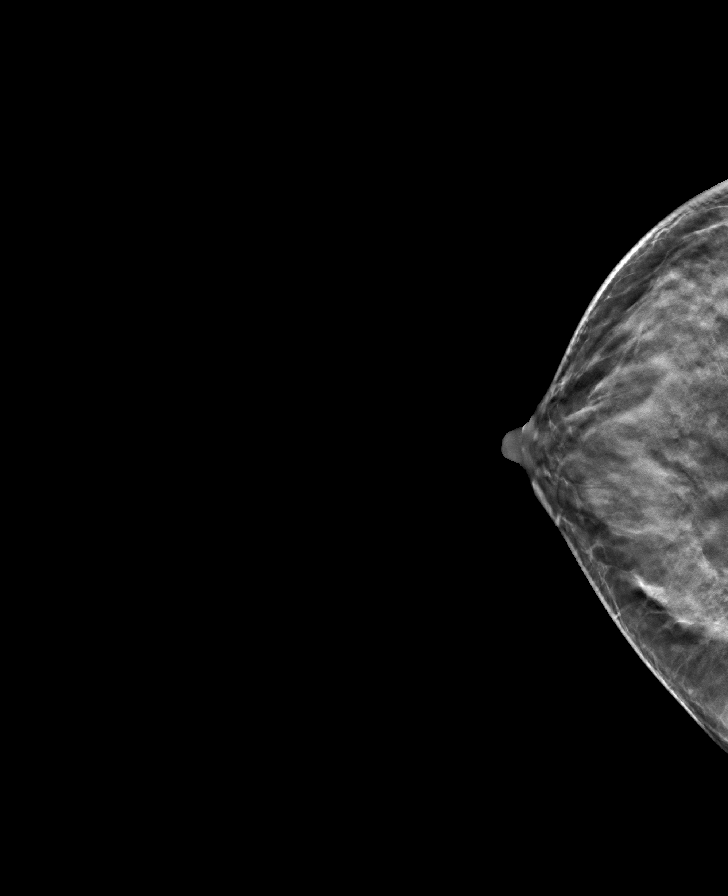

[L MLO tomo · tomo slice 31/61.0]
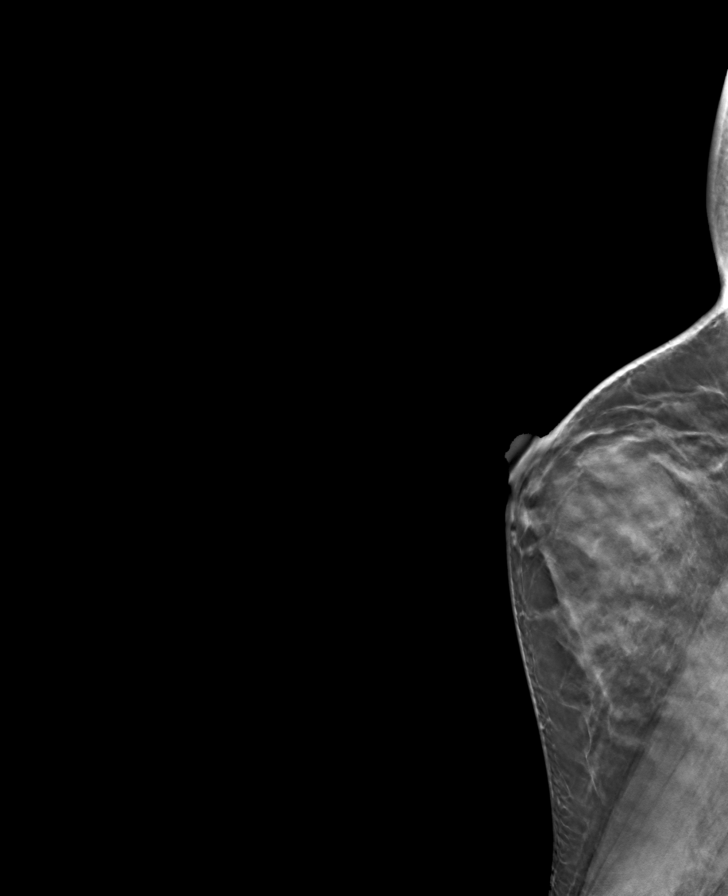

[8 of 24 positions shown; findings below may reference images not displayed]

ACR Breast Density Category c: The breast tissue is heterogeneously
dense, which may obscure small masses.
FINDINGS: There are no findings suspicious for malignancy. Images were
processed with CAD.
IMPRESSION: No mammographic evidence of malignancy. A result letter of this
screening mammogram will be mailed directly to the patient.

RECOMMENDATION:
Screening mammogram in one year. (Code:[id number]-[id number])

BI-RADS CATEGORY  1: Negative.

## 2020-03-04 ENCOUNTER — Other Ambulatory Visit: Payer: Self-pay

## 2020-03-04 ENCOUNTER — Ambulatory Visit: Payer: Managed Care, Other (non HMO) | Admitting: Nurse Practitioner

## 2020-03-04 VITALS — BP 114/60 | HR 76 | Temp 97.6°F | Resp 16 | Ht 65.0 in | Wt 150.0 lb

## 2020-03-04 DIAGNOSIS — H1013 Acute atopic conjunctivitis, bilateral: Secondary | ICD-10-CM

## 2020-03-04 DIAGNOSIS — H9201 Otalgia, right ear: Secondary | ICD-10-CM

## 2020-03-04 NOTE — Progress Notes (Signed)
  Subjective:     Patient ID: Alexandria Anderson, female   DOB: 06-Jun-1966, 54 y.o.   MRN: [id number]  Otalgia  There is pain in the right ear. This is a new problem. The current episode started in the past 7 days. The problem occurs every few hours. The problem has been unchanged. There has been no fever. The pain is at a severity of 6/10. Treatments tried: Zyrtec, Flonase. The treatment provided mild relief.  Conjunctivitis  The current episode started 2 days ago. The onset was gradual. Episode frequency: am upon waking only. The problem has been unchanged. The problem is mild. Nothing aggravates the symptoms. Associated symptoms include ear pain and eye discharge. Pertinent negatives include no congestion. The eye pain is mild. Both eyes are affected.The eye pain is not associated with movement. Eyelid abnormality: crusty in am.   PMH: Vit D deficiency, GERD, Anemia, Asthma as child Surg  Review of Systems  HENT: Positive for ear pain. Negative for congestion.   Eyes: Positive for discharge.  All other systems reviewed and are negative.      Objective: BP 114/60 (BP Location: Right Arm, Patient Position: Sitting, Cuff Size: Normal)   Pulse 76   Temp 97.6 F (36.4 C) (Temporal)   Resp 16   Ht 5\' 5"  (1.651 m)   Wt 150 lb (68 kg)   LMP 12/30/2014 (Approximate)   SpO2 98%   BMI 24.96 kg/m     Physical Exam Constitutional:      Appearance: Normal appearance.  HENT:     Head: Normocephalic and atraumatic.     Right Ear: Tympanic membrane normal. There is impacted cerumen.     Left Ear: Tympanic membrane normal.     Nose: Nose normal.     Mouth/Throat:     Mouth: Mucous membranes are moist.  Eyes:     Extraocular Movements: Extraocular movements intact.     Conjunctiva/sclera: Conjunctivae normal.     Comments: No erythema or drainage noted.  Cardiovascular:     Rate and Rhythm: Normal rate and regular rhythm.  Pulmonary:     Effort: Pulmonary effort is normal.     Breath  sounds: No wheezing, rhonchi or rales.  Musculoskeletal:     Cervical back: Normal range of motion.  Skin:    General: Skin is warm and dry.  Neurological:     General: No focal deficit present.     Mental Status: She is alert.  Psychiatric:        Mood and Affect: Mood normal.   Procedure: Ceruminosis is noted right ear.  Wax is removed by syringing and manual debridement. Employee tolerated the procedure without pain or problems.      Assessment:    54 y.o. female here with c/o right ear pain and crusty eyelids in the mornings. Cerumen right ear, normal TM's bilateral. No signs of conjunctivitis at this time.     Plan:      Ear irrigation right ear, cerumen removed. Discussed with employee viral conjunctivitis and OTC drops. Discussed ear discomfort associated with allergies. Employee given opportunity to ask questions. All questions answered verbally and in writing on d/c instructions. Employee voices understanding. She will continue Zyrtec and Flonase. She will return for worsening symptoms.

## 2020-03-04 NOTE — Patient Instructions (Signed)
Allergic Conjunctivitis  You may use Over the counter eye drops to help keep the eyes moist.  A clear membrane (conjunctiva) covers the white part of your eye and the inner surface of your eyelid. Allergic conjunctivitis happens when this membrane has inflammation. This is caused by allergies. Common causes of allergic reactions (allergens) include:  Outdoor allergens, such as: ? Pollen. ? Grass and weeds. ? Mold spores.  Indoor allergens, such as: ? Dust. ? Smoke. ? Mold. ? Pet dander. ? Animal hair. This condition can make your eye red or pink. It can also make your eye feel itchy. This condition cannot be spread from one person to another person (is not contagious). Follow these instructions at home:  Try not to be around things that you are allergic to.  Take or apply over-the-counter and prescription medicines only as told by your doctor. These include any eye drops.  Place a cool, clean washcloth on your eye for 10-20 minutes. Do this 3-4 times a day.  Do not touch or rub your eyes.  Do not wear contact lenses until the inflammation is gone. Wear glasses instead.  Do not wear eye makeup until the inflammation is gone.  Keep all follow-up visits as told by your doctor. This is important. Contact a doctor if:  Your symptoms get worse.  Your symptoms do not get better with treatment.  You have mild eye pain.  You are sensitive to light,  You have spots or blisters on your eyes.  You have pus coming from your eye.  You have a fever. Get help right away if:  You have redness, swelling, or other symptoms in only one eye.  Your vision is blurry.  You have vision changes.  You have very bad eye pain. Summary  Allergic conjunctivitis is caused by allergies. It can make your eye red or pink, and it can make your eye feel itchy.  This condition cannot be spread from one person to another person (is not contagious).  Try not to be around things that you are  allergic to.  Take or apply over-the-counter and prescription medicines only as told by your doctor. These include any eye drops.  Contact your doctor if your symptoms get worse or they do not get better with treatment. This information is not intended to replace advice given to you by your health care provider. Make sure you discuss any questions you have with your health care provider. Document Revised: 11/06/2018 Document Reviewed: 03/11/2016 Elsevier Patient Education  2020 [place] Irrigation Ear irrigation is a procedure to wash dirt and wax out of your ear canal. This procedure is also called lavage. You may need ear irrigation if you are having trouble hearing because of a buildup of earwax. You may also have ear irrigation as part of the treatment for an ear infection. Getting wax and dirt out of your ear canal can help some medicines (ear drops) work better. How is ear irrigation performed? The procedure may vary among health care providers and hospitals. In general:  You may be given ear drops to put in your ear 15-20 minutes before irrigation. This helps loosen the wax.  A syringe containing water or a sterile salt solution (saline) can be gently inserted into the ear canal. The saline is used to flush out wax and other debris. Ear irrigation kits are also available for use at home. Ask your health care provider if this is an option for you. Use a home  irrigation kit only as told by your health care provider. Read the package instructions carefully. Follow the directions for using the syringe. Use water that is room temperature. Do not do ear irrigation at home if you:  Have diabetes. Diabetes increases the risk of infection.  Have a hole or tear in your eardrum.  Have tubes in your ears.  Have had any ear surgery in the past.  Have been instructed not to irrigate your ears. What are the risks of ear irrigation? Generally, this is a safe procedure. However,  problems may occur, including:  Infection.  Pain.  Hearing loss.  Pushing water and debris into the eardrum. This can occur if there are holes in the eardrum.  Ear irrigation failing to work. How should I care for my ears after irrigation? After an ear irrigation, follow instructions given to you by your healthcare provider. Cleaning   Clean the outside of your ear with a soft washcloth daily.  If told by your health care provider, use a few drops of baby oil, mineral oil, glycerin, hydrogen peroxide, or over-the-counter earwax softening drops.  Do not use cotton swabs to clean your ears. These can push wax down into the ear canal.  Do not put anything into your ears to try to remove wax. This includes ear candles. General instructions  Take over-the-counter and prescription medicines only as told by your health care provider.  If you were prescribed an antibiotic medicine, use it as told by your health care provider. Do not stop using the antibiotic even if your condition improves.  Keep all follow-up visits as told by your health care provider. This is important.  Visit your health care provider at least once a year to have your ears and hearing checked. Follow these instructions at home:  Keep the ear clean and dry by following the instructions from your healthcare provider. Contact a health care provider if:  Your hearing is not improving or is getting worse.  You have pain or redness in your ear.  You are dizzy.  You have ringing in your ears.  You have nausea or vomiting.  You have fluid, blood, or pus coming out of your ear. Summary  Ear irrigation is a procedure to wash dirt and wax out of your ear canal. This procedure is also called lavage.  To perform ear irrigation, ear drops may be put in your ear 15-20 minutes before irrigation. Water or sterile salt solution (saline) will be used to flush out wax and other debris.  You may be able to irrigate your  ears at home. Ask your health care provider if this is an option for you. Follow your health care provider's instructions.  Clean your ears with a soft cloth after irrigation. Do not use cotton swabs to clean your ears. These can push wax down into the ear canal. This information is not intended to replace advice given to you by your health care provider. Make sure you discuss any questions you have with your health care provider. Document Revised: 04/16/2018 Document Reviewed: 04/16/2018 Elsevier Patient Education  [place].

## 2020-04-14 ENCOUNTER — Other Ambulatory Visit: Payer: Managed Care, Other (non HMO)

## 2020-04-14 ENCOUNTER — Other Ambulatory Visit: Payer: Self-pay

## 2020-04-14 DIAGNOSIS — Z131 Encounter for screening for diabetes mellitus: Secondary | ICD-10-CM

## 2020-04-14 DIAGNOSIS — D519 Vitamin B12 deficiency anemia, unspecified: Secondary | ICD-10-CM

## 2020-04-14 DIAGNOSIS — Z13220 Encounter for screening for lipoid disorders: Secondary | ICD-10-CM | POA: Diagnosis not present

## 2020-04-15 LAB — CBC WITH DIFFERENTIAL/PLATELET
Basophils Absolute: 0 10*3/uL (ref 0.0–0.2)
Basos: 0 %
EOS (ABSOLUTE): 0.1 10*3/uL (ref 0.0–0.4)
Eos: 1 %
Hematocrit: 39 % (ref 34.0–46.6)
Hemoglobin: 12.9 g/dL (ref 11.1–15.9)
Immature Grans (Abs): 0 10*3/uL (ref 0.0–0.1)
Immature Granulocytes: 0 %
Lymphocytes Absolute: 2.9 10*3/uL (ref 0.7–3.1)
Lymphs: 43 %
MCH: 29.3 pg (ref 26.6–33.0)
MCHC: 33.1 g/dL (ref 31.5–35.7)
MCV: 88 fL (ref 79–97)
Monocytes Absolute: 0.5 10*3/uL (ref 0.1–0.9)
Monocytes: 8 %
Neutrophils Absolute: 3.3 10*3/uL (ref 1.4–7.0)
Neutrophils: 48 %
Platelets: 238 10*3/uL (ref 150–450)
RBC: 4.41 x10E6/uL (ref 3.77–5.28)
RDW: 13.8 % (ref 11.7–15.4)
WBC: 6.9 10*3/uL (ref 3.4–10.8)

## 2020-04-15 LAB — COMPREHENSIVE METABOLIC PANEL
ALT: 12 IU/L (ref 0–32)
AST: 15 IU/L (ref 0–40)
Albumin/Globulin Ratio: 2 (ref 1.2–2.2)
Albumin: 4.7 g/dL (ref 3.8–4.9)
Alkaline Phosphatase: 70 IU/L (ref 44–121)
BUN/Creatinine Ratio: 15 (ref 9–23)
BUN: 11 mg/dL (ref 6–24)
Bilirubin Total: 0.4 mg/dL (ref 0.0–1.2)
CO2: 24 mmol/L (ref 20–29)
Calcium: 9.3 mg/dL (ref 8.7–10.2)
Chloride: 101 mmol/L (ref 96–106)
Creatinine, Ser: 0.74 mg/dL (ref 0.57–1.00)
GFR calc Af Amer: 108 mL/min/{1.73_m2} (ref >59)
GFR calc non Af Amer: 94 mL/min/{1.73_m2} (ref >59)
Globulin, Total: 2.4 g/dL (ref 1.5–4.5)
Glucose: 93 mg/dL (ref 65–99)
Potassium: 4.4 mmol/L (ref 3.5–5.2)
Sodium: 137 mmol/L (ref 134–144)
Total Protein: 7.1 g/dL (ref 6.0–8.5)

## 2020-04-15 LAB — LIPID PANEL
Chol/HDL Ratio: 3.2 ratio (ref 0.0–4.4)
Cholesterol, Total: 154 mg/dL (ref 100–199)
HDL: 48 mg/dL (ref >39)
LDL Chol Calc (NIH): 91 mg/dL (ref 0–99)
Triglycerides: 79 mg/dL (ref 0–149)
VLDL Cholesterol Cal: 15 mg/dL (ref 5–40)

## 2020-04-15 LAB — VITAMIN D 25 HYDROXY (VIT D DEFICIENCY, FRACTURES): Vit D, 25-Hydroxy: 15.4 ng/mL — ABNORMAL LOW (ref 30.0–100.0)

## 2020-04-15 LAB — HGB A1C W/O EAG: Hgb A1c MFr Bld: 5.9 % — ABNORMAL HIGH (ref 4.8–5.6)

## 2020-05-06 ENCOUNTER — Other Ambulatory Visit: Payer: Self-pay

## 2020-05-06 ENCOUNTER — Ambulatory Visit
Admission: RE | Admit: 2020-05-06 | Discharge: 2020-05-06 | Disposition: A | Discharge status: Home / Self Care | Payer: Managed Care, Other (non HMO) | Source: Ambulatory Visit | Attending: Obstetrics and Gynecology | Admitting: Obstetrics and Gynecology

## 2020-05-06 DIAGNOSIS — Z1231 Encounter for screening mammogram for malignant neoplasm of breast: Secondary | ICD-10-CM | POA: Insufficient documentation

## 2020-05-07 ENCOUNTER — Other Ambulatory Visit: Payer: Self-pay | Admitting: Obstetrics and Gynecology

## 2020-05-07 DIAGNOSIS — R928 Other abnormal and inconclusive findings on diagnostic imaging of breast: Secondary | ICD-10-CM

## 2020-05-07 DIAGNOSIS — N6489 Other specified disorders of breast: Secondary | ICD-10-CM

## 2020-05-08 ENCOUNTER — Other Ambulatory Visit: Payer: Self-pay | Admitting: Obstetrics and Gynecology

## 2020-05-08 DIAGNOSIS — N760 Acute vaginitis: Secondary | ICD-10-CM

## 2020-05-13 NOTE — Telephone Encounter (Signed)
I called and spoke with patient. She will contact [place]

## 2020-05-19 ENCOUNTER — Other Ambulatory Visit: Payer: Self-pay

## 2020-05-19 ENCOUNTER — Ambulatory Visit
Admission: RE | Admit: 2020-05-19 | Discharge: 2020-05-19 | Disposition: A | Discharge status: Home / Self Care | Payer: Managed Care, Other (non HMO) | Source: Ambulatory Visit | Attending: Obstetrics and Gynecology | Admitting: Obstetrics and Gynecology

## 2020-05-19 DIAGNOSIS — R928 Other abnormal and inconclusive findings on diagnostic imaging of breast: Secondary | ICD-10-CM | POA: Diagnosis not present

## 2020-05-19 DIAGNOSIS — N6489 Other specified disorders of breast: Secondary | ICD-10-CM | POA: Insufficient documentation

## 2020-10-16 ENCOUNTER — Other Ambulatory Visit: Payer: Self-pay | Admitting: Obstetrics and Gynecology

## 2020-10-16 DIAGNOSIS — Z5181 Encounter for therapeutic drug level monitoring: Secondary | ICD-10-CM

## 2021-01-25 ENCOUNTER — Encounter: Payer: Self-pay | Admitting: *Deleted

## 2021-01-26 ENCOUNTER — Other Ambulatory Visit: Payer: Self-pay | Admitting: Obstetrics and Gynecology

## 2021-01-26 DIAGNOSIS — A499 Bacterial infection, unspecified: Secondary | ICD-10-CM

## 2021-01-26 DIAGNOSIS — N760 Acute vaginitis: Secondary | ICD-10-CM

## 2021-02-18 IMAGING — MG MM DIGITAL DIAGNOSTIC UNILAT*R* W/ TOMO W/ CAD
6 series · 6 of 18 positions shown · non-contrast
Comparison: Previous exam(s).

CLINICAL DATA: Screening recall for a right breast asymmetry.

EXAM:
DIGITAL DIAGNOSTIC UNILATERAL RIGHT MAMMOGRAM WITH TOMO AND CAD

[R MLO synth-2D (1 of 2)]
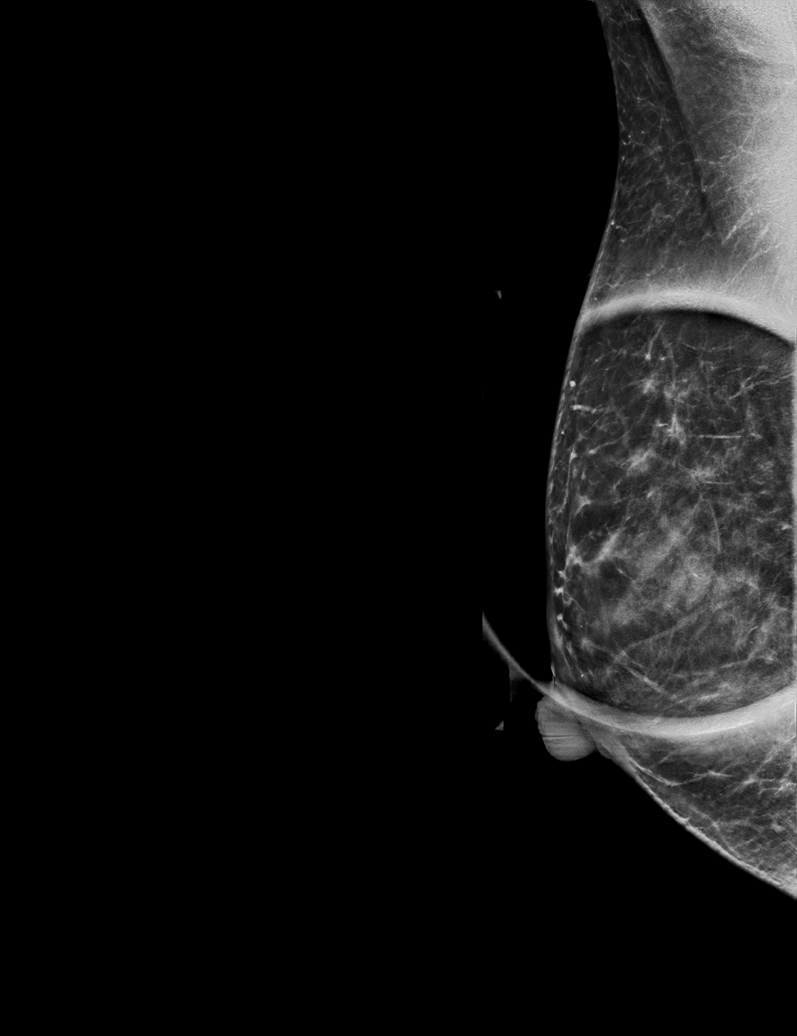

[R ML synth-2D]
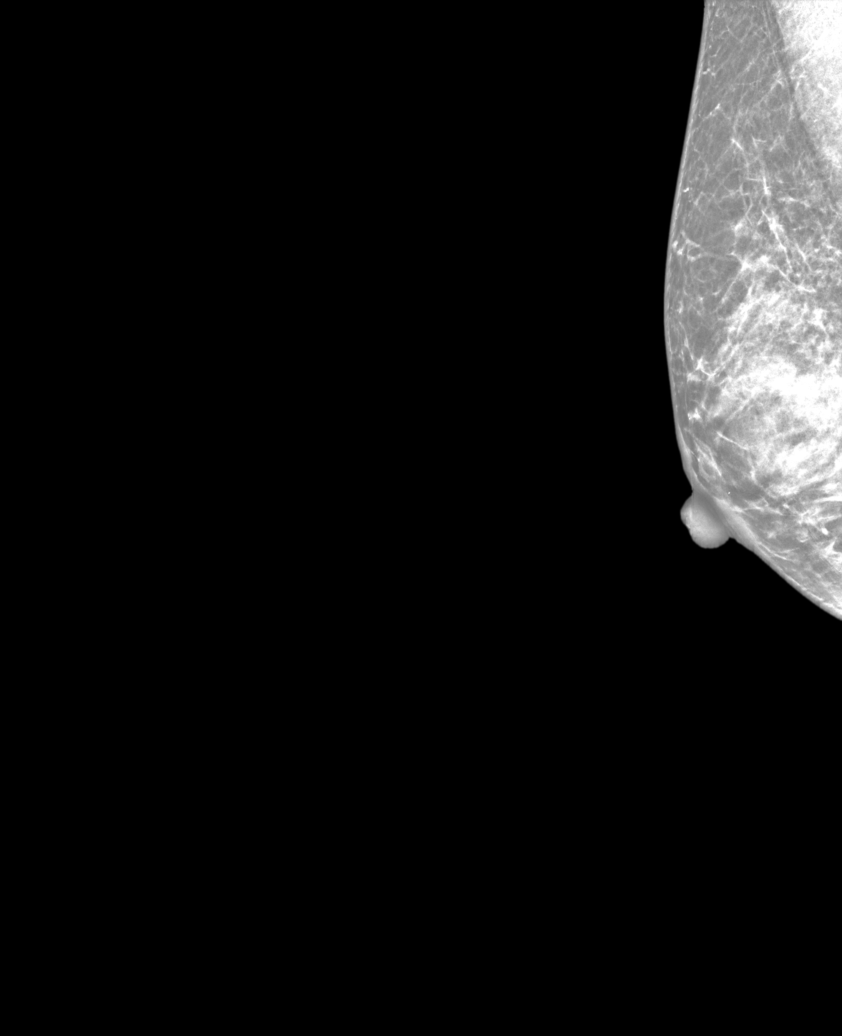

[R MLO synth-2D (2 of 2)]
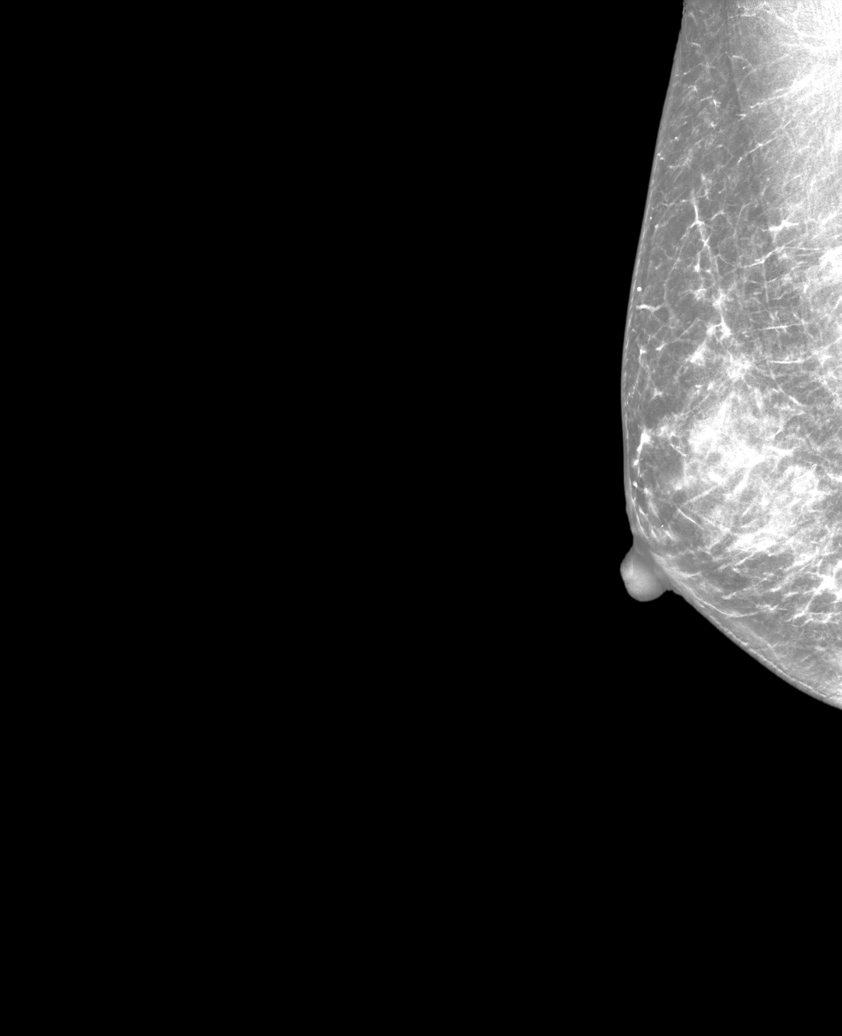

[R ML tomo · tomo slice 29/56.0]
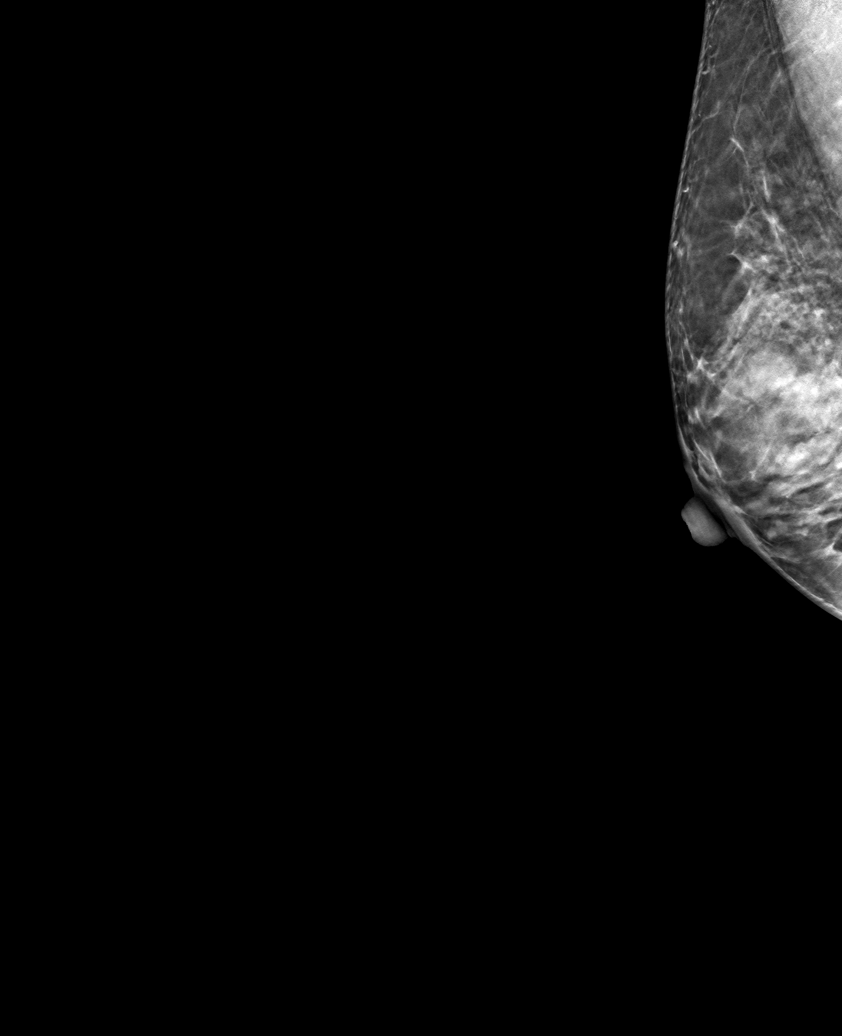

[R MLO tomo (1 of 2) · tomo slice 31/62.0]
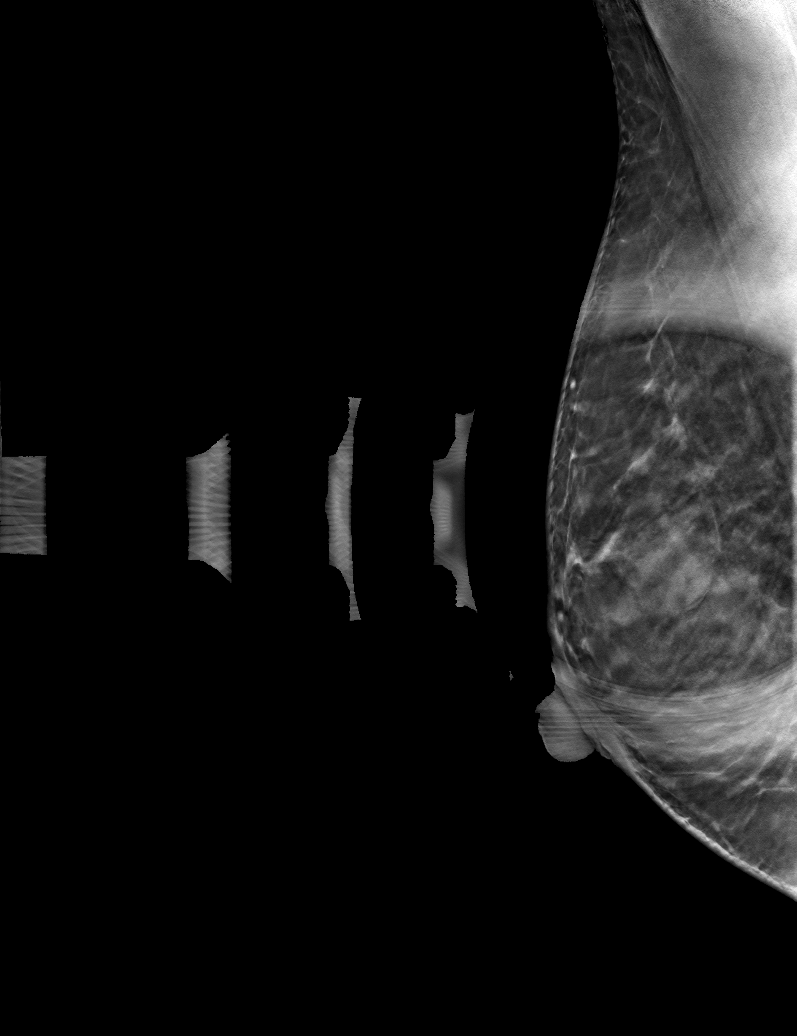

[R MLO tomo (2 of 2) · tomo slice 31/60.0]
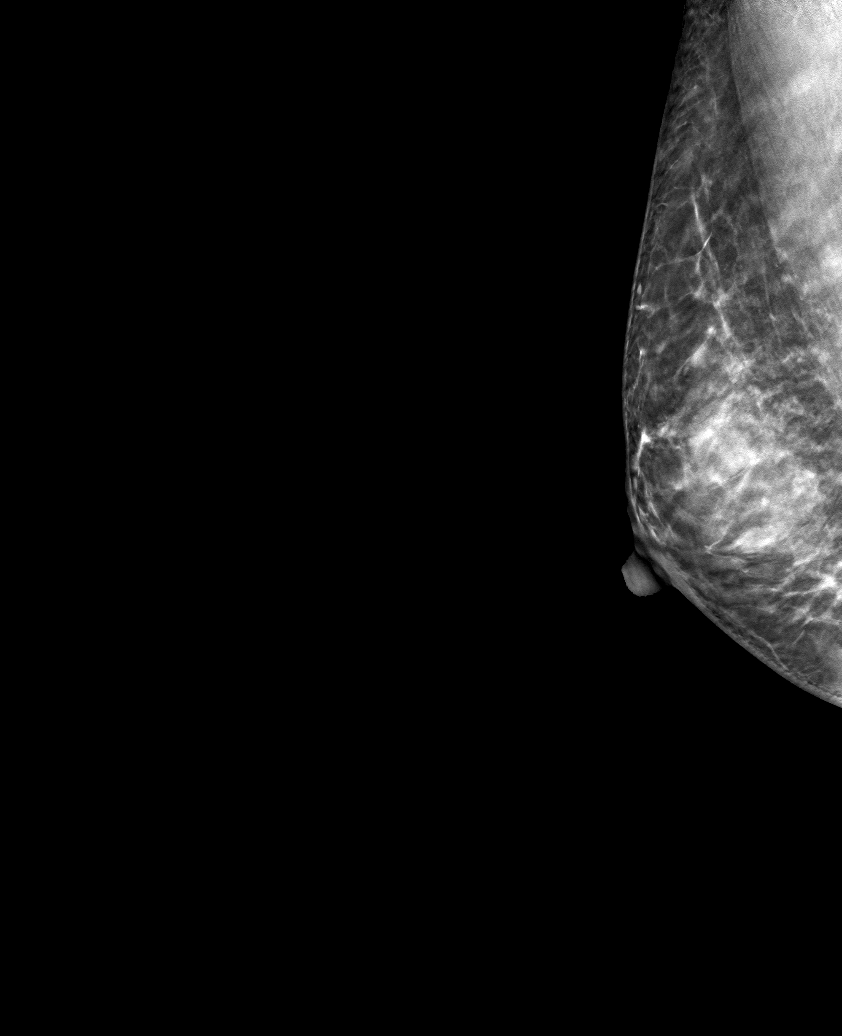

[6 of 18 positions shown; findings below may reference images not displayed]

ACR Breast Density Category c: The breast tissue is heterogeneously
dense, which may obscure small masses.
FINDINGS: The asymmetry of concern in the superior right breast resolves both
on the spot compression tomosynthesis images in the MLO projection,
as well as the full paddle true lateral images.

Mammographic images were processed with CAD.
IMPRESSION: Resolution of the right breast asymmetry consistent with overlapping
fibroglandular tissue.

RECOMMENDATION:
Screening mammogram in one year.(Code:[id number])

I have discussed the findings and recommendations with the patient.
If applicable, a reminder letter will be sent to the patient
regarding the next appointment.

BI-RADS CATEGORY  1: Negative.

## 2021-03-31 ENCOUNTER — Other Ambulatory Visit: Payer: Self-pay

## 2021-04-01 ENCOUNTER — Encounter: Payer: Self-pay | Admitting: Gastroenterology

## 2021-04-01 ENCOUNTER — Other Ambulatory Visit: Payer: Self-pay

## 2021-04-01 ENCOUNTER — Ambulatory Visit: Payer: BC Managed Care – PPO | Admitting: Gastroenterology

## 2021-04-01 VITALS — BP 124/74 | HR 73 | Temp 98.3°F | Ht 65.0 in | Wt 147.8 lb

## 2021-04-01 DIAGNOSIS — K219 Gastro-esophageal reflux disease without esophagitis: Secondary | ICD-10-CM

## 2021-04-01 NOTE — Progress Notes (Signed)
Cephas Darby, MD [address]  [facility]  [place], [place] [address]  Main: [phone]  Fax: [phone]    Gastroenterology Consultation  Referring Provider:     Perrin Maltese, MD Primary Care Physician:  Perrin Maltese, MD Primary Gastroenterologist:  Dr. Cephas Darby Reason for Consultation:     Chronic GERD        HPI:   Alexandria Anderson is a 55 y.o. female referred by Dr. Perrin Maltese, MD  for consultation & management of chronic GERD.  Patient is concerned about several years history of reflux, regurgitation, burping.  Her symptoms have been controlled on omeprazole 20 mg daily for at least 10 years, however over the last 1 year she has been experiencing worsening of the symptoms including nighttime awakening from cough and regurgitation.  She drinks coffee in the morning.  She is currently on Aciphex 20 mg da labs are unrevealing except for vitamin D deficiency.  [place].  She stays physically active, exercises regularly.  She denies any dysphagia.  Patient denies smoking, alcohol use  NSAIDs: None  Antiplts/Anticoagulants/Anti thrombotics: None  GI Procedures:  Colonoscopy 10/07/2016 for screening - Non-bleeding internal hemorrhoids. - The examined portion of the ileum was normal. - No specimens collected.  Past Medical History:  Diagnosis Date   Allergy    Anemia    Asthma    as a child   GERD (gastroesophageal reflux disease)    PONV (postoperative nausea and vomiting)    after first foot surgery   Vitamin D deficiency     Past Surgical History:  Procedure Laterality Date   ABDOMINAL HYSTERECTOMY     COLONOSCOPY WITH PROPOFOL N/A 10/07/2016   Procedure: COLONOSCOPY WITH PROPOFOL;  Surgeon: Lucilla Lame, MD;  Location: [facility];  Service: Endoscopy;  Laterality: N/A;   CYSTOSCOPY N/A 02/03/2015   Procedure: CYSTOSCOPY;  Surgeon: Will Bonnet, MD;  Location: [facility];  Service: Gynecology;  Laterality: N/A;   HAMMER TOE SURGERY Bilateral     LAPAROSCOPIC HYSTERECTOMY N/A 02/03/2015   Procedure: HYSTERECTOMY TOTAL LAPAROSCOPIC/BILATERAL SALPINGECTOMY;  Surgeon: Will Bonnet, MD;  Location: [facility];  Service: Gynecology;  Laterality: N/A;    Current Outpatient Medications:    baclofen (LIORESAL) 10 MG tablet, Take 1 tablet (10 mg total) by mouth 3 (three) times daily as needed for muscle spasms. For muscle spasms, Disp: 90 tablet, Rfl: 0   estradiol (VIVELLE-DOT) 0.075 MG/24HR, PLACE 1 PATCH ONTO THE SKIN 2 (TWO) TIMES A WEEK., Disp: 24 patch, Rfl: 4   fluconazole (DIFLUCAN) 150 MG tablet, TAKE 1 TABLET BY MOUTH ONE TIME PER WEEK, Disp: 12 tablet, Rfl: 6   metoprolol succinate (TOPROL-XL) 25 MG 24 hr tablet, Take 25 mg by mouth daily., Disp: , Rfl:    metroNIDAZOLE (METROGEL) 0.75 % vaginal gel, PLACE 1 APPLICATORFUL VAGINALLY 2 (TWO) TIMES A WEEK, Disp: 210 g, Rfl: 3   RABEprazole (ACIPHEX) 20 MG tablet, Take 20 mg by mouth daily., Disp: , Rfl:    SOOLANTRA 1 % CREA, Apply to face once daily, Disp: , Rfl:    sucralfate (CARAFATE) 1 g tablet, Sucralfate 1 GM Oral Tablet QTY: 90 tablet Days: 30 Refills: 1  Written: 08/19/20 Patient Instructions: TAKE 1 TABLET BY MOUTH THREE TIMES A DAY, Disp: , Rfl:    triamcinolone ointment (KENALOG) 0.1 %, Apply topically 2 (two) times daily as needed., Disp: , Rfl:    albuterol (VENTOLIN HFA) 108 (90 Base) MCG/ACT  inhaler, ProAir HFA 108 (90 Base) MCG/ACT Inhalation Aerosol Solution QTY: 1 inhaler Days: 30 Refills: 2  Written: 07/19/18 Patient Instructions: 1-2 puffs every 4-6 hours as needed (Patient not taking: Reported on 04/01/2021), Disp: , Rfl:    SOLOSEC 2 g PACK, MIX 1 PACKET IN APPLESAUCE, THEN EAT. FINISH WITHIN 15 MINUTES. (Patient not taking: No sig reported), Disp: , Rfl:     Family History  Problem Relation Age of Onset   Hypertension Mother    Hypercholesterolemia Mother    Hyperlipidemia Mother    Heart disease Mother    Alcohol abuse Father    Cirrhosis Father    Breast  cancer Neg Hx      Social History   Tobacco Use   Smoking status: Never   Smokeless tobacco: Never  Vaping Use   Vaping Use: Never used  Substance Use Topics   Alcohol use: Yes    Alcohol/week: 2.0 standard drinks    Types: 2 Glasses of wine per week    Comment:     Drug use: No    Allergies as of 04/01/2021 - Review Complete 04/01/2021  Allergen Reaction Noted   Prednisone Palpitations 07/10/2018    Review of Systems:    All systems reviewed and negative except where noted in HPI.   Physical Exam:  BP 124/74 (BP Location: Left Arm, Patient Position: Sitting, Cuff Size: Normal)   Pulse 73   Temp 98.3 F (36.8 C) (Oral)   Ht '5\' 5"'$  (1.651 m)   Wt 147 lb 12.8 oz (67 kg)   LMP 12/30/2014 (Approximate)   BMI 24.60 kg/m  Patient's last menstrual period was 12/30/2014 (approximate).  General:   Alert,  Well-developed, well-nourished, pleasant and cooperative in NAD Head:  Normocephalic and atraumatic. Eyes:  Sclera clear, no icterus.   Conjunctiva pink. Ears:  Normal auditory acuity. Nose:  No deformity, discharge, or lesions. Mouth:  No deformity or lesions,oropharynx pink & moist. Neck:  Supple; no masses or thyromegaly. Lungs:  Respirations even and unlabored.  Clear throughout to auscultation.   No wheezes, crackles, or rhonchi. No acute distress. Heart:  Regular rate and rhythm; no murmurs, clicks, rubs, or gallops. Abdomen:  Normal bowel sounds. Soft, non-tender and non-distended without masses, hepatosplenomegaly or hernias noted.  No guarding or rebound tenderness.   Rectal: Not performed Msk:  Symmetrical without gross deformities. Good, equal movement & strength bilaterally. Pulses:  Normal pulses noted. Extremities:  No clubbing or edema.  No cyanosis. Neurologic:  Alert and oriented x3;  grossly normal neurologically. Skin:  Intact without significant lesions or rashes. No jaundice. Psych:  Alert and cooperative. Normal mood and affect.  Imaging  Studies: No abdominal imaging  Assessment and Plan:   Alexandria Anderson is a 55 y.o. pleasant female with chronic GERD is seen in consultation for worsening of her GERD symptoms over last 1 year, currently on low-dose PPI  Discussed about antireflux lifestyle, information provided Increase Aciphex to 20 mg twice daily before meals Recommend EGD with esophageal biopsies   Follow up in 6 months   Cephas Darby, MD

## 2021-04-01 NOTE — Patient Instructions (Signed)
Gastroesophageal Reflux Disease, Adult Gastroesophageal reflux (GER) happens when acid from the stomach flows up into the tube that connects the mouth and the stomach (esophagus). Normally, food travels down the esophagus and stays in the stomach to be digested. With GER, food and stomach acid sometimes move back up into the esophagus. You may have a disease called gastroesophageal reflux disease (GERD) if the reflux: Happens often. Causes frequent or very bad symptoms. Causes problems such as damage to the esophagus. When this happens, the esophagus becomes sore and swollen. Over time, GERD can make small holes (ulcers) in the lining of the esophagus. What are the causes? This condition is caused by a problem with the muscle between the esophagus and the stomach. When this muscle is weak or not normal, it does not close properly to keep food and acid from coming back up from the stomach. The muscle can be weak because of: Tobacco use. Pregnancy. Having a certain type of hernia (hiatal hernia). Alcohol use. Certain foods and drinks, such as coffee, chocolate, onions, and peppermint. What increases the risk? Being overweight. Having a disease that affects your connective tissue. Taking NSAIDs, such a ibuprofen. What are the signs or symptoms? Heartburn. Difficult or painful swallowing. The feeling of having a lump in the throat. A bitter taste in the mouth. Bad breath. Having a lot of saliva. Having an upset or bloated stomach. Burping. Chest pain. Different conditions can cause chest pain. Make sure you see your doctor if you have chest pain. Shortness of breath or wheezing. A long-term cough or a cough at night. Wearing away of the surface of teeth (tooth enamel). Weight loss. How is this treated? Making changes to your diet. Taking medicine. Having surgery. Treatment will depend on how bad your symptoms are. Follow these instructions at home: Eating and drinking  Follow a  diet as told by your doctor. You may need to avoid foods and drinks such as: Coffee and tea, with or without caffeine. Drinks that contain alcohol. Energy drinks and sports drinks. Bubbly (carbonated) drinks or sodas. Chocolate and cocoa. Peppermint and mint flavorings. Garlic and onions. Horseradish. Spicy and acidic foods. These include peppers, chili powder, curry powder, vinegar, hot sauces, and BBQ sauce. Citrus fruit juices and citrus fruits, such as oranges, lemons, and limes. Tomato-based foods. These include red sauce, chili, salsa, and pizza with red sauce. Fried and fatty foods. These include donuts, french fries, potato chips, and high-fat dressings. High-fat meats. These include hot dogs, rib eye steak, sausage, ham, and bacon. High-fat dairy items, such as whole milk, butter, and cream cheese. Eat small meals often. Avoid eating large meals. Avoid drinking large amounts of liquid with your meals. Avoid eating meals during the 2-3 hours before bedtime. Avoid lying down right after you eat. Do not exercise right after you eat. Lifestyle  Do not smoke or use any products that contain nicotine or tobacco. If you need help quitting, ask your doctor. Try to lower your stress. If you need help doing this, ask your doctor. If you are overweight, lose an amount of weight that is healthy for you. Ask your doctor about a safe weight loss goal. General instructions Pay attention to any changes in your symptoms. Take over-the-counter and prescription medicines only as told by your doctor. Do not take aspirin, ibuprofen, or other NSAIDs unless your doctor says it is okay. Wear loose clothes. Do not wear anything tight around your waist. Raise (elevate) the head of your bed about 6  inches (15 cm). You may need to use a wedge to do this. Avoid bending over if this makes your symptoms worse. Keep all follow-up visits. Contact a doctor if: You have new symptoms. You lose weight and you  do not know why. You have trouble swallowing or it hurts to swallow. You have wheezing or a cough that keeps happening. You have a hoarse voice. Your symptoms do not get better with treatment. Get help right away if: You have sudden pain in your arms, neck, jaw, teeth, or back. You suddenly feel sweaty, dizzy, or light-headed. You have chest pain or shortness of breath. You vomit and the vomit is green, yellow, or black, or it looks like blood or coffee grounds. You faint. Your poop (stool) is red, bloody, or black. You cannot swallow, drink, or eat. These symptoms may represent a serious problem that is an emergency. Do not wait to see if the symptoms will go away. Get medical help right away. Call your local emergency services (911 in the [place] Do not drive yourself to the hospital. Summary If a person has gastroesophageal reflux disease (GERD), food and stomach acid move back up into the esophagus and cause symptoms or problems such as damage to the esophagus. Treatment will depend on how bad your symptoms are. Follow a diet as told by your doctor. Take all medicines only as told by your doctor. This information is not intended to replace advice given to you by your health care provider. Make sure you discuss any questions you have with your health care provider. Document Revised: 01/27/2020 Document Reviewed: 01/27/2020 Elsevier Patient Education  [place].

## 2021-04-12 ENCOUNTER — Telehealth: Payer: Self-pay

## 2021-04-12 ENCOUNTER — Other Ambulatory Visit: Payer: Self-pay

## 2021-04-12 NOTE — Progress Notes (Signed)
Patient called to reschedule her appointment to 04/29/2021 it has been done and new letters sent out and new referral to [facility] sent

## 2021-04-12 NOTE — Telephone Encounter (Signed)
Pt. Calling to cancel an upcoming procedure

## 2021-04-15 NOTE — Addendum Note (Signed)
Addended by: Cephas Darby on: 04/15/2021 04:52 PM   Modules accepted: Level of Service

## 2021-04-28 ENCOUNTER — Encounter: Payer: Self-pay | Admitting: Gastroenterology

## 2021-04-29 ENCOUNTER — Encounter: Payer: Self-pay | Admitting: Gastroenterology

## 2021-04-29 ENCOUNTER — Encounter
Admission: RE | Disposition: A | Discharge status: Home / Self Care | Payer: Self-pay | Source: Home / Self Care | Attending: Gastroenterology

## 2021-04-29 ENCOUNTER — Ambulatory Visit: Payer: BC Managed Care – PPO | Admitting: Anesthesiology

## 2021-04-29 ENCOUNTER — Other Ambulatory Visit: Payer: Self-pay

## 2021-04-29 ENCOUNTER — Ambulatory Visit
Admission: RE | Admit: 2021-04-29 | Discharge: 2021-04-29 | Disposition: A | Discharge status: Home / Self Care | Payer: BC Managed Care – PPO | Attending: Gastroenterology | Admitting: Gastroenterology

## 2021-04-29 DIAGNOSIS — Z79890 Hormone replacement therapy (postmenopausal): Secondary | ICD-10-CM | POA: Insufficient documentation

## 2021-04-29 DIAGNOSIS — Z888 Allergy status to other drugs, medicaments and biological substances status: Secondary | ICD-10-CM | POA: Diagnosis not present

## 2021-04-29 DIAGNOSIS — Z79899 Other long term (current) drug therapy: Secondary | ICD-10-CM | POA: Diagnosis not present

## 2021-04-29 DIAGNOSIS — K219 Gastro-esophageal reflux disease without esophagitis: Secondary | ICD-10-CM | POA: Diagnosis present

## 2021-04-29 HISTORY — PX: ESOPHAGOGASTRODUODENOSCOPY (EGD) WITH PROPOFOL: SHX5813

## 2021-04-29 LAB — BASIC METABOLIC PANEL
Anion gap: 8 (ref 5–15)
BUN: 15 mg/dL (ref 6–20)
CO2: 28 mmol/L (ref 22–32)
Calcium: 9.1 mg/dL (ref 8.9–10.3)
Chloride: 102 mmol/L (ref 98–111)
Creatinine, Ser: 0.85 mg/dL (ref 0.44–1.00)
GFR, Estimated: >60 mL/min (ref >60)
Glucose, Bld: 91 mg/dL (ref 70–99)
Potassium: 4.1 mmol/L (ref 3.5–5.1)
Sodium: 138 mmol/L (ref 135–145)

## 2021-04-29 LAB — MAGNESIUM: Magnesium: 1.9 mg/dL (ref 1.7–2.4)

## 2021-04-29 SURGERY — ESOPHAGOGASTRODUODENOSCOPY (EGD) WITH PROPOFOL
Anesthesia: General

## 2021-04-29 MED ORDER — MIDAZOLAM HCL 2 MG/2ML IJ SOLN
INTRAMUSCULAR | Status: AC
  Filled 2021-04-29: qty 2

## 2021-04-29 MED ORDER — ONDANSETRON HCL 4 MG/2ML IJ SOLN
INTRAMUSCULAR | Status: AC
  Filled 2021-04-29: qty 2

## 2021-04-29 MED ORDER — PROPOFOL 10 MG/ML IV BOLUS
INTRAVENOUS | Status: DC | PRN
Start: 2021-04-29 — End: 2021-04-29
  Administered 2021-04-29: 100 mg via INTRAVENOUS

## 2021-04-29 MED ORDER — PROPOFOL 500 MG/50ML IV EMUL
INTRAVENOUS | Status: DC | PRN
  Administered 2021-04-29: 120 ug/kg/min via INTRAVENOUS

## 2021-04-29 MED ORDER — ONDANSETRON HCL 4 MG/2ML IJ SOLN
INTRAMUSCULAR | Status: DC | PRN
  Administered 2021-04-29: 4 mg via INTRAVENOUS

## 2021-04-29 MED ORDER — GLYCOPYRROLATE 0.2 MG/ML IJ SOLN
INTRAMUSCULAR | Status: DC | PRN
Start: 2021-04-29 — End: 2021-04-29
  Administered 2021-04-29: .2 mg via INTRAVENOUS

## 2021-04-29 MED ORDER — MIDAZOLAM HCL 5 MG/5ML IJ SOLN
INTRAMUSCULAR | Status: DC | PRN
  Administered 2021-04-29: 2 mg via INTRAVENOUS

## 2021-04-29 MED ORDER — LIDOCAINE 2% (20 MG/ML) 5 ML SYRINGE
INTRAMUSCULAR | Status: DC | PRN
  Administered 2021-04-29: 25 mg via INTRAVENOUS

## 2021-04-29 MED ORDER — SODIUM CHLORIDE 0.9 % IV SOLN: INTRAVENOUS | Status: DC

## 2021-04-29 MED ORDER — GLYCOPYRROLATE 0.2 MG/ML IJ SOLN
INTRAMUSCULAR | Status: AC
  Filled 2021-04-29: qty 1

## 2021-04-29 NOTE — Anesthesia Postprocedure Evaluation (Signed)
Anesthesia Post Note  Patient: Alexandria Anderson  Procedure(s) Performed: ESOPHAGOGASTRODUODENOSCOPY (EGD) WITH PROPOFOL  Patient location during evaluation: Endoscopy Anesthesia Type: General Level of consciousness: awake and alert Pain management: pain level controlled Vital Signs Assessment: post-procedure vital signs reviewed and stable Respiratory status: spontaneous breathing, nonlabored ventilation, respiratory function stable and patient connected to nasal cannula oxygen Cardiovascular status: blood pressure returned to baseline and stable Postop Assessment: no apparent nausea or vomiting Anesthetic complications: no   No notable events documented.   Last Vitals:  Vitals:   04/29/21 0907  BP: 123/74  Pulse: 62  Resp: 18  Temp: (!) 36.2 C  SpO2: 100%    Last Pain:  Vitals:   04/29/21 1022  TempSrc:   PainSc: 0-No pain                 Precious Haws Ammarie Matsuura

## 2021-04-29 NOTE — H&P (Signed)
Cephas Darby, MD [address]  [place]  [place], [place] [address]  Main: [phone]  Fax: [phone] Pager: [phone]  Primary Care Physician:  Perrin Maltese, MD Primary Gastroenterologist:  Dr. Cephas Darby  Pre-Procedure History & Physical: HPI:  Alexandria Anderson is a 55 y.o. female is here for an endoscopy.   Past Medical History:  Diagnosis Date   Allergy    Anemia    Asthma    as a child   GERD (gastroesophageal reflux disease)    PONV (postoperative nausea and vomiting)    after first foot surgery   Vitamin D deficiency     Past Surgical History:  Procedure Laterality Date   ABDOMINAL HYSTERECTOMY     COLONOSCOPY WITH PROPOFOL N/A 10/07/2016   Procedure: COLONOSCOPY WITH PROPOFOL;  Surgeon: Lucilla Lame, MD;  Location: [place];  Service: Endoscopy;  Laterality: N/A;   CYSTOSCOPY N/A 02/03/2015   Procedure: CYSTOSCOPY;  Surgeon: Will Bonnet, MD;  Location: [facility];  Service: Gynecology;  Laterality: N/A;   HAMMER TOE SURGERY Bilateral    LAPAROSCOPIC HYSTERECTOMY N/A 02/03/2015   Procedure: HYSTERECTOMY TOTAL LAPAROSCOPIC/BILATERAL SALPINGECTOMY;  Surgeon: Will Bonnet, MD;  Location: [facility];  Service: Gynecology;  Laterality: N/A;    Prior to Admission medications   Medication Sig Start Date End Date Taking? Authorizing Provider  albuterol (VENTOLIN HFA) 108 (90 Base) MCG/ACT inhaler ProAir HFA 108 (90 Base) MCG/ACT Inhalation Aerosol Solution QTY: 1 inhaler Days: 30 Refills: 2  Written: 07/19/18 Patient Instructions: 1-2 puffs every 4-6 hours as needed Patient not taking: Reported on 04/01/2021 07/19/18   [provider]  baclofen (LIORESAL) 10 MG tablet Take 1 tablet (10 mg total) by mouth 3 (three) times daily as needed for muscle spasms. For muscle spasms 10/31/17   Maximino Sarin, FNP  estradiol (VIVELLE-DOT) 0.075 MG/24HR PLACE 1 PATCH ONTO THE SKIN 2 (TWO) TIMES A WEEK. 10/19/20   Schuman, Christanna R, MD   fluconazole (DIFLUCAN) 150 MG tablet TAKE 1 TABLET BY MOUTH ONE TIME PER WEEK 05/08/20   Schuman, Christanna R, MD  metoprolol succinate (TOPROL-XL) 25 MG 24 hr tablet Take 25 mg by mouth daily. 01/25/21   [provider]  metroNIDAZOLE (METROGEL) 0.75 % vaginal gel PLACE 1 APPLICATORFUL VAGINALLY 2 (TWO) TIMES A WEEK 01/27/21   Schuman, Christanna R, MD  RABEprazole (ACIPHEX) 20 MG tablet Take 20 mg by mouth daily. 01/19/21   [provider]  SOLOSEC 2 g PACK MIX 1 PACKET IN APPLESAUCE, THEN EAT. FINISH WITHIN 15 MINUTES. Patient not taking: No sig reported 09/20/19   [provider]  SOOLANTRA 1 % CREA Apply to face once daily 11/23/18   [provider]  sucralfate (CARAFATE) 1 g tablet Sucralfate 1 GM Oral Tablet QTY: 90 tablet Days: 30 Refills: 1  Written: 08/19/20 Patient Instructions: TAKE 1 TABLET BY MOUTH THREE TIMES A DAY 08/19/20   [provider]  triamcinolone ointment (KENALOG) 0.1 % Apply topically 2 (two) times daily as needed. 03/22/21   [provider]    Allergies as of 04/01/2021 - Review Complete 04/01/2021  Allergen Reaction Noted   Prednisone Palpitations 07/10/2018    Family History  Problem Relation Age of Onset   Hypertension Mother    Hypercholesterolemia Mother    Hyperlipidemia Mother    Heart disease Mother    Alcohol abuse Father    Cirrhosis Father    Breast cancer Neg Hx  Social History   Socioeconomic History   Marital status: Married    Spouse name: Not on file   Number of children: Not on file   Years of education: Not on file   Highest education level: Not on file  Occupational History   Not on file  Tobacco Use   Smoking status: Never   Smokeless tobacco: Never  Vaping Use   Vaping Use: Never used  Substance and Sexual Activity   Alcohol use: Yes    Alcohol/week: 2.0 standard drinks    Types: 2 Glasses of wine per week    Comment:     Drug use: No   Sexual activity: Yes    Birth  control/protection: Post-menopausal, Surgical  Other Topics Concern   Not on file  Social History Narrative   Not on file   Social Determinants of Health   Financial Resource Strain: Not on file  Food Insecurity: Not on file  Transportation Needs: Not on file  Physical Activity: Not on file  Stress: Not on file  Social Connections: Not on file  Intimate Partner Violence: Not on file    Review of Systems: See HPI, otherwise negative ROS  Physical Exam: BP 123/74   Pulse 62   Temp (!) 97.2 F (36.2 C) (Temporal)   Resp 18   Ht 5\' 5"  (1.651 m)   Wt 65.8 kg   LMP 12/30/2014 (Approximate)   SpO2 100%   BMI 24.13 kg/m  General:   Alert,  pleasant and cooperative in NAD Head:  Normocephalic and atraumatic. Neck:  Supple; no masses or thyromegaly. Lungs:  Clear throughout to auscultation.    Heart:  Regular rate and rhythm. Abdomen:  Soft, nontender and nondistended. Normal bowel sounds, without guarding, and without rebound.   Neurologic:  Alert and  oriented x4;  grossly normal neurologically.  Impression/Plan: Alexandria Anderson is here for an endoscopy to be performed for chronic gerd  Risks, benefits, limitations, and alternatives regarding  endoscopy have been reviewed with the patient.  Questions have been answered.  All parties agreeable.   Sherri Sear, MD  04/29/2021, 9:14 AM

## 2021-04-29 NOTE — Op Note (Signed)
[facility] Patient Name: Alexandria Anderson Procedure Date: 04/29/2021 9:05 AM MRN: [id number] Account #: [id number] Date of Birth: 1966-02-06 Admit Type: Outpatient Age: 55 Room: [facility] ENDO ROOM 4 Gender: Female Note Status: Finalized Instrument Name: Upper Endoscope [id number] Procedure:             Upper GI endoscopy Indications:           Esophageal reflux symptoms that persist despite                         appropriate therapy Providers:             Lin Landsman MD, MD Referring MD:          Perrin Maltese, MD (Referring MD) Medicines:             General Anesthesia Complications:         No immediate complications. Estimated blood loss: None. Procedure:             Pre-Anesthesia Assessment:                        - Prior to the procedure, a History and Physical was                         performed, and patient medications and allergies were                         reviewed. The patient is competent. The risks and                         benefits of the procedure and the sedation options and                         risks were discussed with the patient. All questions                         were answered and informed consent was obtained.                         Patient identification and proposed procedure were                         verified by the physician, the nurse, the                         anesthesiologist, the anesthetist and the technician                         in the pre-procedure area in the procedure room in the                         endoscopy suite. Mental Status Examination: alert and                         oriented. Airway Examination: normal oropharyngeal                         airway and neck mobility. Respiratory Examination:  clear to auscultation. CV Examination: normal.                         Prophylactic Antibiotics: The patient does not require                         prophylactic  antibiotics. Prior Anticoagulants: The                         patient has taken no previous anticoagulant or                         antiplatelet agents. ASA Grade Assessment: III - A                         patient with severe systemic disease. After reviewing                         the risks and benefits, the patient was deemed in                         satisfactory condition to undergo the procedure. The                         anesthesia plan was to use general anesthesia.                         Immediately prior to administration of medications,                         the patient was re-assessed for adequacy to receive                         sedatives. The heart rate, respiratory rate, oxygen                         saturations, blood pressure, adequacy of pulmonary                         ventilation, and response to care were monitored                         throughout the procedure. The physical status of the                         patient was re-assessed after the procedure.                        After obtaining informed consent, the endoscope was                         passed under direct vision. Throughout the procedure,                         the patient's blood pressure, pulse, and oxygen                         saturations were monitored continuously. The Endoscope  was introduced through the mouth, and advanced to the                         second part of duodenum. The upper GI endoscopy was                         accomplished without difficulty. The patient tolerated                         the procedure well. Findings:      The examined duodenum was normal.      The entire examined stomach was normal.      The cardia and gastric fundus were normal on retroflexion.      Esophagogastric landmarks were identified: the gastroesophageal junction       was found at 38 cm from the incisors.      The gastroesophageal junction and examined  esophagus were normal.       Biopsies were taken with a cold forceps for histology. Impression:            - Normal examined duodenum.                        - Normal stomach.                        - Esophagogastric landmarks identified.                        - Normal gastroesophageal junction and esophagus.                         Biopsied. Recommendation:        - Discharge patient to home (with escort).                        - Resume previous diet today.                        - Continue present medications.                        - Follow an antireflux regimen.                        - Await pathology results. Procedure Code(s):     --- Professional ---                        [phone], Esophagogastroduodenoscopy, flexible,                         transoral; with biopsy, single or multiple Diagnosis Code(s):     --- Professional ---                        K21.9, Gastro-esophageal reflux disease without                         esophagitis CPT copyright 2019 American Medical Association. All rights reserved. The codes documented in this report are preliminary and upon coder review may  be revised to meet current compliance requirements. Dr. Ulyess Mort Tally Due  Marius Ditch MD, MD 04/29/2021 9:39:01 AM This report has been signed electronically. Number of Addenda: 0 Note Initiated On: 04/29/2021 9:05 AM Estimated Blood Loss:  Estimated blood loss: none.      [facility]

## 2021-04-29 NOTE — Anesthesia Preprocedure Evaluation (Signed)
Anesthesia Evaluation  Patient identified by MRN, date of birth, ID band Patient awake    Reviewed: Allergy & Precautions, NPO status , Patient's Chart, lab work & pertinent test results  History of Anesthesia Complications (+) PONV and history of anesthetic complications  Airway Mallampati: III  TM Distance: >3 FB Neck ROM: full    Dental  (+) Chipped   Pulmonary asthma ,    Pulmonary exam normal        Cardiovascular Exercise Tolerance: Good (-) anginanegative cardio ROS Normal cardiovascular exam     Neuro/Psych PSYCHIATRIC DISORDERS negative neurological ROS     GI/Hepatic Neg liver ROS, GERD  ,  Endo/Other  negative endocrine ROS  Renal/GU negative Renal ROS  negative genitourinary   Musculoskeletal   Abdominal   Peds  Hematology negative hematology ROS (+)   Anesthesia Other Findings Past Medical History: No date: Allergy No date: Anemia No date: Asthma     Comment:  as a child No date: GERD (gastroesophageal reflux disease) No date: PONV (postoperative nausea and vomiting)     Comment:  after first foot surgery No date: Vitamin D deficiency  Past Surgical History: No date: ABDOMINAL HYSTERECTOMY 10/07/2016: COLONOSCOPY WITH PROPOFOL; N/A     Comment:  Procedure: COLONOSCOPY WITH PROPOFOL;  Surgeon: Lucilla Lame, MD;  Location: [place];  Service:               Endoscopy;  Laterality: N/A; 02/03/2015: CYSTOSCOPY; N/A     Comment:  Procedure: CYSTOSCOPY;  Surgeon: Will Bonnet, MD;               Location: [facility];  Service: Gynecology;  Laterality:               N/A; No date: HAMMER TOE SURGERY; Bilateral 02/03/2015: LAPAROSCOPIC HYSTERECTOMY; N/A     Comment:  Procedure: HYSTERECTOMY TOTAL LAPAROSCOPIC/BILATERAL               SALPINGECTOMY;  Surgeon: Will Bonnet, MD;                Location: [facility];  Service: Gynecology;  Laterality:               N/A;      Reproductive/Obstetrics negative OB ROS                             Anesthesia Physical Anesthesia Plan  ASA: 3  Anesthesia Plan: General   Post-op Pain Management:    Induction: Intravenous  PONV Risk Score and Plan: Propofol infusion and TIVA  Airway Management Planned: Natural Airway and Nasal Cannula  Additional Equipment:   Intra-op Plan:   Post-operative Plan:   Informed Consent: I have reviewed the patients History and Physical, chart, labs and discussed the procedure including the risks, benefits and alternatives for the proposed anesthesia with the patient or authorized representative who has indicated his/her understanding and acceptance.     Dental Advisory Given  Plan Discussed with: Anesthesiologist, CRNA and Surgeon  Anesthesia Plan Comments: (Patient consented for risks of anesthesia including but not limited to:  - adverse reactions to medications - risk of airway placement if required - damage to eyes, teeth, lips or other oral mucosa - nerve damage due to positioning  - sore throat or hoarseness - Damage to heart, brain, nerves, lungs, other parts of  body or loss of life  Patient voiced understanding.)        Anesthesia Quick Evaluation

## 2021-04-29 NOTE — Transfer of Care (Signed)
Immediate Anesthesia Transfer of Care Note  Patient: Alexandria Anderson  Procedure(s) Performed: ESOPHAGOGASTRODUODENOSCOPY (EGD) WITH PROPOFOL  Patient Location: Endoscopy Unit  Anesthesia Type:General  Level of Consciousness: drowsy  Airway & Oxygen Therapy: Patient Spontanous Breathing  Post-op Assessment: Report given to RN and Post -op Vital signs reviewed and stable  Post vital signs: Reviewed  Last Vitals:  Vitals Value Taken Time  BP 119/78 04/29/21 0941  Temp    Pulse 72 04/29/21 0941  Resp 11 04/29/21 0941  SpO2 96 % 04/29/21 0941  Vitals shown include unvalidated device data.  Last Pain:  Vitals:   04/29/21 0907  TempSrc: Temporal  PainSc: 0-No pain         Complications: No notable events documented.

## 2021-04-30 ENCOUNTER — Encounter: Payer: Self-pay | Admitting: Gastroenterology

## 2021-04-30 ENCOUNTER — Other Ambulatory Visit: Payer: Self-pay | Admitting: Gastroenterology

## 2021-04-30 DIAGNOSIS — K219 Gastro-esophageal reflux disease without esophagitis: Secondary | ICD-10-CM

## 2021-04-30 LAB — SURGICAL PATHOLOGY

## 2021-04-30 MED ORDER — RABEPRAZOLE SODIUM 20 MG PO TBEC: 20.0000 mg | DELAYED_RELEASE_TABLET | Freq: Two times a day (BID) | ORAL | 1 refills | Status: DC

## 2021-05-05 ENCOUNTER — Other Ambulatory Visit: Payer: Self-pay | Admitting: Obstetrics and Gynecology

## 2021-05-05 ENCOUNTER — Other Ambulatory Visit: Payer: Self-pay | Admitting: Diagnostic Radiology

## 2021-05-05 DIAGNOSIS — Z1231 Encounter for screening mammogram for malignant neoplasm of breast: Secondary | ICD-10-CM

## 2021-05-25 ENCOUNTER — Other Ambulatory Visit: Payer: Self-pay

## 2021-05-25 ENCOUNTER — Ambulatory Visit
Admission: RE | Admit: 2021-05-25 | Discharge: 2021-05-25 | Disposition: A | Discharge status: Home / Self Care | Payer: BC Managed Care – PPO | Source: Ambulatory Visit | Attending: Obstetrics and Gynecology | Admitting: Obstetrics and Gynecology

## 2021-05-25 DIAGNOSIS — Z1231 Encounter for screening mammogram for malignant neoplasm of breast: Secondary | ICD-10-CM | POA: Diagnosis not present

## 2021-06-21 ENCOUNTER — Other Ambulatory Visit: Payer: Self-pay | Admitting: Obstetrics and Gynecology

## 2021-06-21 DIAGNOSIS — N760 Acute vaginitis: Secondary | ICD-10-CM

## 2021-06-23 ENCOUNTER — Telehealth: Payer: Self-pay

## 2021-06-23 NOTE — Telephone Encounter (Signed)
Pt is going to do a "walk-in" on Friday.  She didn't think she could wait until next week.

## 2021-06-23 NOTE — Telephone Encounter (Signed)
Pt calling for rx for a yeast infection; has done monistat and diflucan; still has itchiness and irritation.  [phone]

## 2021-06-23 NOTE — Telephone Encounter (Signed)
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

## 2021-10-09 ENCOUNTER — Other Ambulatory Visit: Payer: Self-pay | Admitting: Gastroenterology

## 2021-10-09 DIAGNOSIS — K219 Gastro-esophageal reflux disease without esophagitis: Secondary | ICD-10-CM

## 2021-11-02 ENCOUNTER — Other Ambulatory Visit: Payer: Self-pay | Admitting: Obstetrics and Gynecology

## 2021-11-02 DIAGNOSIS — Z5181 Encounter for therapeutic drug level monitoring: Secondary | ICD-10-CM

## 2021-11-17 ENCOUNTER — Other Ambulatory Visit: Payer: Self-pay | Admitting: Gastroenterology

## 2021-11-17 DIAGNOSIS — K219 Gastro-esophageal reflux disease without esophagitis: Secondary | ICD-10-CM

## 2021-11-30 ENCOUNTER — Ambulatory Visit: Payer: Managed Care, Other (non HMO) | Admitting: Licensed Practical Nurse

## 2021-12-15 ENCOUNTER — Encounter: Payer: Self-pay | Admitting: Licensed Practical Nurse

## 2021-12-15 ENCOUNTER — Ambulatory Visit (INDEPENDENT_AMBULATORY_CARE_PROVIDER_SITE_OTHER): Payer: BC Managed Care – PPO | Admitting: Licensed Practical Nurse

## 2021-12-15 ENCOUNTER — Other Ambulatory Visit (HOSPITAL_COMMUNITY)
Admission: RE | Admit: 2021-12-15 | Discharge: 2021-12-15 | Disposition: A | Discharge status: Home / Self Care | Payer: BC Managed Care – PPO | Source: Ambulatory Visit | Attending: Licensed Practical Nurse | Admitting: Licensed Practical Nurse

## 2021-12-15 VITALS — BP 120/60 | Ht 65.0 in | Wt 158.0 lb

## 2021-12-15 DIAGNOSIS — Z124 Encounter for screening for malignant neoplasm of cervix: Secondary | ICD-10-CM | POA: Insufficient documentation

## 2021-12-15 DIAGNOSIS — Z113 Encounter for screening for infections with a predominantly sexual mode of transmission: Secondary | ICD-10-CM | POA: Insufficient documentation

## 2021-12-15 DIAGNOSIS — N898 Other specified noninflammatory disorders of vagina: Secondary | ICD-10-CM | POA: Insufficient documentation

## 2021-12-15 DIAGNOSIS — Z01419 Encounter for gynecological examination (general) (routine) without abnormal findings: Secondary | ICD-10-CM

## 2021-12-15 DIAGNOSIS — Z1239 Encounter for other screening for malignant neoplasm of breast: Secondary | ICD-10-CM

## 2021-12-15 NOTE — Progress Notes (Signed)
? ? ? ?Gynecology Annual Exam  ?PCP: Perrin Maltese, MD ? ?Chief Complaint:  ?Chief Complaint  ?Patient presents with  ? Gynecologic Exam  ? ? ?History of Present Illness:Patient is a 56 y.o. Alexandria Anderson presents for annual exam. The patient wonders how long she can be on estradiol.  She has awful hot flashes when not on it.  ?-gets BV and yeast often, desires swab today  ? ?LMP: Patient's last menstrual period was 12/30/2014 (approximate). ?Hysterectomy, cervix present  ?Postcoital Bleeding: no ? ? ?The patient is sexually active with 1 female partner. She denies dyspareunia.  The patient does perform self breast exams.  There is no notable family history of breast or ovarian cancer in her family. ? ?The patient wears seatbelts: yes.   The patient has regular exercise: yes.   ? ?The patient denies current symptoms of depression.   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 Likes to work out, listen to music, use essential oils and read to decrease stress.  ?Lives with her husband, she feels safe ?PCP Dr Humphrey Rolls ?Dental: goes every 6 months ?Eyes: wears glasses has an apt next week  ?Does not consume a lot of dairy nor takes supplements so gets little Calcium and Vitamin D, does have a prescription for Vitamin  D  ? ? ? ?Review of Systems: Review of Systems  ?Constitutional: Negative.   ?Eyes: Negative.   ?Respiratory: Negative.    ?Cardiovascular: Negative.   ?Gastrointestinal: Negative.   ?Genitourinary: Negative.   ?Musculoskeletal: Negative.   ?Neurological: Negative.   ?Endo/Heme/Allergies: Negative.   ?Psychiatric/Behavioral: Negative.    ? ?Past Medical History:  ?Patient Active Problem List  ? Diagnosis Date Noted  ? Generalized anxiety disorder 07/19/2018  ? 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 Allergic rhinitis due to pollen 05/03/2018  ? Esophageal reflux 05/03/2018  ? 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 Climacteric 08/16/2017  ?  Blood in stool   ? ? ?Past Surgical History:  ?Past Surgical History:  ?Procedure Laterality Date  ? ABDOMINAL HYSTERECTOMY    ? COLONOSCOPY WITH PROPOFOL N/A 10/07/2016  ? Procedure: COLONOSCOPY WITH PROPOFOL;  Surgeon: Lucilla Lame, MD;  Location: [facility];  Service: Endoscopy;  Laterality: N/A;  ? CYSTOSCOPY N/A 02/03/2015  ? Procedure: CYSTOSCOPY;  Surgeon: Will Bonnet, MD;  Location: [facility];  Service: Gynecology;  Laterality: N/A;  ? ESOPHAGOGASTRODUODENOSCOPY (EGD) WITH PROPOFOL N/A 04/29/2021  ? Procedure: ESOPHAGOGASTRODUODENOSCOPY (EGD) WITH PROPOFOL;  Surgeon: Lin Landsman, MD;  Location: [facility];  Service: Gastroenterology;  Laterality: N/A;  ? HAMMER TOE SURGERY Bilateral   ? LAPAROSCOPIC HYSTERECTOMY N/A 02/03/2015  ? Procedure: HYSTERECTOMY TOTAL LAPAROSCOPIC/BILATERAL SALPINGECTOMY;  Surgeon: Will Bonnet, MD;  Location: [facility];  Service: Gynecology;  Laterality: N/A;  ? ? ?Gynecologic History:  ?Patient's last menstrual period was 12/30/2014 (approximate). ?Last Pap: Results were: 2019  NA   ?Last mammogram: Oct 2022 Results were: BI-RAD I ? ?Obstetric History: [id number] ? ?Family History:  ?Family History  ?Problem Relation Age of Onset  ? Hypertension Mother   ? Hypercholesterolemia Mother   ? Hyperlipidemia Mother   ? Heart disease Mother   ? Alcohol abuse Father   ? Cirrhosis Father   ? Breast cancer Neg Hx   ? ? ?Social History:  ?Social History  ? ?Socioeconomic History  ? Marital status: Married  ?  Spouse name: Not on file  ?  Number of children: Not on file  ? Years of education: Not on file  ? Highest education level: Not on file  ?Occupational History  ? Not on file  ?Tobacco Use  ? Smoking status: Never  ? Smokeless tobacco: Never  ?Vaping Use  ? Vaping Use: Never used  ?Substance and Sexual Activity  ? Alcohol use: Yes  ?  Alcohol/week: 2.0 standard drinks  ?  Types: 2 Glasses of wine per week  ?  Comment:    ? Drug use: No  ? Sexual activity: Yes  ?  Birth  control/protection: Post-menopausal, Surgical  ?Other Topics Concern  ? Not on file  ?Social History Narrative  ? Not on file  ? ?Social Determinants of Health  ? ?Financial Resource Strain: Not on file  ?Food Insecurity: Not on file  ?Transportation Needs: Not on file  ?Physical Activity: Not on file  ?Stress: Not on file  ?Social Connections: Not on file  ?Intimate Partner Violence: Not on file  ? ? ?Allergies:  ?Allergies  ?Allergen Reactions  ? Prednisone Palpitations  ? ? ?Medications: ?Prior to Admission medications   ?Medication Sig Start Date End Date Taking? Authorizing Provider  ?baclofen (LIORESAL) 10 MG tablet Take 1 tablet (10 mg total) by mouth 3 (three) times daily as needed for muscle spasms. For muscle spasms 10/31/17  Yes McManama, Franchot Mimes, FNP  ?estradiol (VIVELLE-DOT) 0.075 MG/24HR APPLY 1 PATCH TOPICALLY TWICE A WEEK 11/03/21  Yes Schuman, Christanna R, MD  ?fluconazole (DIFLUCAN) 150 MG tablet TAKE 1 TABLET BY MOUTH ONE TIME PER WEEK 06/21/21  Yes Schuman, Christanna R, MD  ?triamcinolone ointment (KENALOG) 0.1 % Apply topically 2 (two) times daily as needed. 03/22/21  Yes [provider]  ?metoprolol succinate (TOPROL-XL) 25 MG 24 hr tablet Take 25 mg by mouth daily. ?Patient not taking: Reported on 12/15/2021 01/25/21   [provider]  ?RABEprazole (ACIPHEX) 20 MG tablet Take 1 tablet (20 mg total) by mouth 2 (two) times daily before a meal. 04/30/21 07/29/21  Vanga, Tally Due, MD  ?Robb Matar 1 % CREA Apply to face once daily ?Patient not taking: Reported on 12/15/2021 11/23/18   [provider]  ? ? ?Physical Exam ?Vitals: Blood pressure 120/60, height '5\' 5"'$  (1.651 m), weight 158 lb (71.7 kg), last menstrual period 12/30/2014. ? ?General: NAD ?HEENT: normocephalic, anicteric ?Thyroid: no enlargement, no palpable nodules ?Pulmonary: No increased work of breathing, CTAB ?Cardiovascular: RRR, distal pulses 2+ ?Breast: Breast symmetrical, no tenderness, no palpable  nodules or masses, no skin or nipple retraction present, no nipple discharge.  No axillary or supraclavicular lymphadenopathy. ?Abdomen: NABS, soft, non-tender, non-distended.  Umbilicus without lesions.  No hepatomegaly, splenomegaly or masses palpable. No evidence of hernia  ?Genitourinary: ? External: Normal external female genitalia.  Normal urethral meatus, normal Bartholin's and Skene's glands.   ? Vagina: Normal vaginal mucosa, no evidence of prolapse.   ? Cervix: pt reports cervix is presents, pink soft tissue visualized in spec  ? Uterus: surgically absent ? Adnexa: ovaries not enlarged  ? Rectal: deferred ? Lymphatic: no evidence of inguinal lymphadenopathy ?Extremities: no edema, erythema, or tenderness ?Neurologic: Grossly intact ?Psychiatric: mood appropriate, affect full ?  ?Assessment: 56 y.o. [id number] routine annual exam ? ?Plan: ?Problem List Items Addressed This Visit   ?None ?Visit Diagnoses   ? ? Well woman exam    -  Primary  ? Relevant Orders  ? HEP, RPR, HIV Panel  ? Hepatitis C antibody  ? Cytology - PAP  ?  Cervicovaginal ancillary only  ? MM DIGITAL SCREENING BILATERAL  ? Cervical cancer screening      ? Relevant Orders  ? Cytology - PAP  ? Screening examination for venereal disease      ? Relevant Orders  ? HEP, RPR, HIV Panel  ? Hepatitis C antibody  ? Vaginal discharge      ? Relevant Orders  ? Cervicovaginal ancillary only  ? Encounter for screening for malignant neoplasm of breast, unspecified screening modality      ? Relevant Orders  ? MM DIGITAL SCREENING BILATERAL  ? ?  ? ? ?1) Mammogram - recommend yearly screening mammogram.  Mammogram Was ordered today ? ?2) STI screening  wasoffered and accepted ? ?3) ASCCP guidelines and rational discussed.  Patient opts for every 5 years screening interval ? ?4) Bone health: start calcium and Vitamin D supplementation ? ?5) Colonoscopy due in 2025.  Screening recommended starting at age 50 for average risk individuals, age 25 for individuals  deemed at increased risk (including African Americans) and recommended to continue until age 55.  For patient age 34-85 individualized approach is recommended.  Gold standard screening is via colonoscopy, Cologua

## 2021-12-16 ENCOUNTER — Encounter: Payer: Self-pay | Admitting: Licensed Practical Nurse

## 2021-12-16 LAB — CERVICOVAGINAL ANCILLARY ONLY
Bacterial Vaginitis (gardnerella): NEGATIVE
Candida Glabrata: NEGATIVE
Candida Vaginitis: NEGATIVE
Comment: NEGATIVE
Comment: NEGATIVE
Comment: NEGATIVE

## 2021-12-16 LAB — HEPATITIS C ANTIBODY: Hep C Virus Ab: NONREACTIVE

## 2021-12-16 LAB — HEP, RPR, HIV PANEL
HIV Screen 4th Generation wRfx: NONREACTIVE
Hepatitis B Surface Ag: NEGATIVE
RPR Ser Ql: NONREACTIVE

## 2021-12-17 ENCOUNTER — Other Ambulatory Visit: Payer: Self-pay | Admitting: Licensed Practical Nurse

## 2021-12-17 ENCOUNTER — Encounter: Payer: Self-pay | Admitting: Licensed Practical Nurse

## 2021-12-17 DIAGNOSIS — Z5181 Encounter for therapeutic drug level monitoring: Secondary | ICD-10-CM

## 2021-12-17 LAB — CYTOLOGY - PAP
Chlamydia: NEGATIVE
Comment: NEGATIVE
Comment: NEGATIVE
Comment: NEGATIVE
Comment: NORMAL
Diagnosis: NEGATIVE
High risk HPV: NEGATIVE
Neisseria Gonorrhea: NEGATIVE
Trichomonas: NEGATIVE

## 2021-12-17 MED ORDER — ESTRADIOL 0.075 MG/24HR TD PTTW: MEDICATED_PATCH | TRANSDERMAL | 0 refills | Status: DC

## 2021-12-29 ENCOUNTER — Other Ambulatory Visit: Payer: Self-pay | Admitting: Licensed Practical Nurse

## 2021-12-29 DIAGNOSIS — Z5181 Encounter for therapeutic drug level monitoring: Secondary | ICD-10-CM

## 2021-12-29 MED ORDER — ESTRADIOL 0.075 MG/24HR TD PTTW: MEDICATED_PATCH | TRANSDERMAL | 4 refills | Status: DC

## 2022-05-13 ENCOUNTER — Other Ambulatory Visit: Payer: Self-pay | Admitting: Family

## 2022-05-13 ENCOUNTER — Encounter: Payer: Self-pay | Admitting: Family

## 2022-05-13 DIAGNOSIS — R109 Unspecified abdominal pain: Secondary | ICD-10-CM

## 2022-05-13 DIAGNOSIS — R102 Pelvic and perineal pain: Secondary | ICD-10-CM

## 2022-05-26 ENCOUNTER — Ambulatory Visit
Admission: RE | Admit: 2022-05-26 | Discharge: 2022-05-26 | Disposition: A | Discharge status: Home / Self Care | Payer: BC Managed Care – PPO | Source: Ambulatory Visit | Attending: Licensed Practical Nurse | Admitting: Licensed Practical Nurse

## 2022-05-26 DIAGNOSIS — Z1231 Encounter for screening mammogram for malignant neoplasm of breast: Secondary | ICD-10-CM | POA: Insufficient documentation

## 2022-05-26 DIAGNOSIS — Z01419 Encounter for gynecological examination (general) (routine) without abnormal findings: Secondary | ICD-10-CM | POA: Diagnosis present

## 2022-05-26 DIAGNOSIS — Z1239 Encounter for other screening for malignant neoplasm of breast: Secondary | ICD-10-CM

## 2022-06-15 ENCOUNTER — Ambulatory Visit: Payer: Self-pay

## 2022-10-03 ENCOUNTER — Ambulatory Visit: Payer: BC Managed Care – PPO | Admitting: Cardiovascular Disease

## 2022-12-08 ENCOUNTER — Ambulatory Visit: Payer: BC Managed Care – PPO | Admitting: Family

## 2023-01-27 ENCOUNTER — Other Ambulatory Visit: Payer: Self-pay | Admitting: Licensed Practical Nurse

## 2023-01-27 DIAGNOSIS — Z5181 Encounter for therapeutic drug level monitoring: Secondary | ICD-10-CM

## 2023-01-31 ENCOUNTER — Encounter: Payer: Self-pay | Admitting: Licensed Practical Nurse

## 2023-01-31 ENCOUNTER — Ambulatory Visit (INDEPENDENT_AMBULATORY_CARE_PROVIDER_SITE_OTHER): Payer: BC Managed Care – PPO | Admitting: Licensed Practical Nurse

## 2023-01-31 VITALS — BP 132/88 | HR 90 | Ht 65.0 in | Wt 155.4 lb

## 2023-01-31 DIAGNOSIS — Z5181 Encounter for therapeutic drug level monitoring: Secondary | ICD-10-CM

## 2023-01-31 DIAGNOSIS — Z01419 Encounter for gynecological examination (general) (routine) without abnormal findings: Secondary | ICD-10-CM | POA: Diagnosis not present

## 2023-01-31 MED ORDER — ESTRADIOL 0.075 MG/24HR TD PTTW: MEDICATED_PATCH | TRANSDERMAL | 4 refills | Status: DC

## 2023-01-31 NOTE — Progress Notes (Signed)
Gynecology Annual Exam  PCP: Margaretann Loveless, MD  Chief Complaint:  Chief Complaint  Patient presents with   Gynecologic Exam    History of Present Illness:Patient is a 57 y.o. [id number] presents for annual exam. The patient has no complaints today. She is considering stopping her estrogen, she has a friend that has recently been diagnosed with a hormone related breast cancer. Alexandria Anderson does experience night sweats most nights. She thinks she may have tried an antidepressant years ago for night sweats, it did not help.   LMP: Patient's last menstrual period was 12/30/2014 (approximate). S/p hysterectomy    The patient is sexually active with 1 female partner. She denies dyspareunia.  The patient does perform self breast exams.  There is no notable family history of breast or ovarian cancer in her family.  The patient wears seatbelts: yes.   The patient has regular exercise: yes.  Walking, pilates, weights,   Manges stress with CBD gummies and meditation  The patient denies current symptoms of depression.    Works as the Film/video editor of [employer] Lives with her husband, feels safe. 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   Climacteric 08/16/2017   Blood in stool     Past Surgical History:  Past Surgical History:  Procedure Laterality Date   ABDOMINAL HYSTERECTOMY     COLONOSCOPY WITH PROPOFOL N/A 10/07/2016   Procedure: COLONOSCOPY WITH PROPOFOL;  Surgeon: Midge Minium, MD;  Location: [facility];  Service: Endoscopy;  Laterality: N/A;   CYSTOSCOPY N/A 02/03/2015    Procedure: CYSTOSCOPY;  Surgeon: Conard Novak, MD;  Location: [facility];  Service: Gynecology;  Laterality: N/A;   ESOPHAGOGASTRODUODENOSCOPY (EGD) WITH PROPOFOL N/A 04/29/2021   Procedure: ESOPHAGOGASTRODUODENOSCOPY (EGD) WITH PROPOFOL;  Surgeon: Toney Reil, MD;  Location: [facility];  Service: Gastroenterology;  Laterality: N/A;   HAMMER TOE SURGERY Bilateral    LAPAROSCOPIC HYSTERECTOMY N/A 02/03/2015   Procedure: HYSTERECTOMY TOTAL LAPAROSCOPIC/BILATERAL SALPINGECTOMY;  Surgeon: Conard Novak, MD;  Location: [facility];  Service: Gynecology;  Laterality: N/A;    Gynecologic History:  Patient's last menstrual period was 12/30/2014 (approximate). Last Pap: Results were: 2023 NIL and HR HPV negative  Last mammogram: 05/2022 Results were: BI-RAD I  Obstetric History: [id number]  Family History:  Family History  Problem Relation Age of Onset   Hypertension Mother    Hypercholesterolemia Mother    Hyperlipidemia Mother    Heart disease Mother    Alcohol abuse Father    Cirrhosis Father    Breast cancer Neg Hx     Social History:  Social History   Socioeconomic History   Marital status: Married    Spouse name: Not on file   Number of children: Not on file   Years of education: Not on file   Highest education level: Not on file  Occupational History   Not on file  Tobacco Use   Smoking status: Never   Smokeless tobacco: Never  Vaping Use   Vaping Use: Never used  Substance and Sexual Activity   Alcohol use: Yes  Alcohol/week: 2.0 standard drinks of alcohol    Types: 2 Glasses of wine per week    Comment:     Drug use: No   Sexual activity: Yes    Birth control/protection: Post-menopausal, Surgical  Other Topics Concern   Not on file  Social History Narrative   Not on file   Social Determinants of Health   Financial Resource Strain: Not on file  Food Insecurity: Not on file  Transportation Needs: Not on file  Physical Activity: Sufficiently  Active (11/14/2017)   Exercise Vital Sign    Days of Exercise per Week: 3 days    Minutes of Exercise per Session: 60 min  Stress: Stress Concern Present (11/14/2017)   [employer] of Occupational Health - Occupational Stress Questionnaire    Feeling of Stress : To some extent  Social Connections: Not on file  Intimate Partner Violence: Not on file    Allergies:  Allergies  Allergen Reactions   Prednisone Palpitations    Medications: Prior to Admission medications   Medication Sig Start Date End Date Taking? Authorizing Provider  baclofen (LIORESAL) 10 MG tablet Take 1 tablet (10 mg total) by mouth 3 (three) times daily as needed for muscle spasms. For muscle spasms 10/31/17  Yes Ralene Muskrat, FNP  estradiol (VIVELLE-DOT) 0.075 MG/24HR APPLY 1 PATCH TOPICALLY TWICE A WEEK 12/29/21  Yes Lanayah Gartley, Courtney Heys, CNM  fluconazole (DIFLUCAN) 150 MG tablet TAKE 1 TABLET BY MOUTH ONE TIME PER WEEK 06/21/21  Yes Schuman, Christanna R, MD  metoprolol succinate (TOPROL-XL) 25 MG 24 hr tablet Take 25 mg by mouth daily. 01/25/21  Yes [provider]  SOOLANTRA 1 % CREA  11/23/18  Yes [provider]  triamcinolone ointment (KENALOG) 0.1 % Apply topically 2 (two) times daily as needed. 03/22/21  Yes [provider]  RABEprazole (ACIPHEX) 20 MG tablet Take 1 tablet (20 mg total) by mouth 2 (two) times daily before a meal. 04/30/21 07/29/21  Toney Reil, MD    Physical Exam Vitals: Blood pressure 132/88, pulse 90, height 5\' 5"  (1.651 m), weight 155 lb 6.4 oz (70.5 kg), last menstrual period 12/30/2014.  General: NAD HEENT: normocephalic, anicteric Thyroid: no enlargement, no palpable nodules Pulmonary: No increased work of breathing, CTAB Cardiovascular: RRR, distal pulses 2+ Breast: Breast symmetrical, no tenderness, no palpable nodules or masses, no skin or nipple retraction present, no nipple discharge.  No axillary or supraclavicular  lymphadenopathy. Abdomen: NABS, soft, non-tender, non-distended.  Umbilicus without lesions.  No hepatomegaly, splenomegaly or masses palpable. No evidence of hernia  Genitourinary:  External: Normal external female genitalia.  Normal urethral meatus, normal Bartholin's and Skene's glands.    Vagina: Normal vaginal mucosa, no evidence of prolapse.  Good tone   Cervix: not assessed  Uterus: surgically absent  Adnexa: ovaries non-enlarged, no adnexal masses  Rectal: deferred  Lymphatic: no evidence of inguinal lymphadenopathy Extremities: no edema, erythema, or tenderness Neurologic: Grossly intact Psychiatric: mood appropriate, affect full     Assessment: 57 y.o. [id number] routine annual exam  Plan: Problem List Items Addressed This Visit   None Visit Diagnoses     Encounter for monitoring postmenopausal estrogen replacement therapy       Relevant Medications   estradiol (VIVELLE-DOT) 0.075 MG/24HR       1) Mammogram - recommend yearly screening mammogram.  Mammogram Was ordered today  2) STI screening  wasoffered and declined  3) ASCCP guidelines and rational discussed.  Patient opts for every 5 years screening  interval  4) Osteoporosis  - per USPTF routine screening DEXA at age 39   Consider FDA-approved medical therapies in postmenopausal women and men aged 8 years and older, based on the following: a) A hip or vertebral (clinical or morphometric) fracture b) T-score ? -2.5 at the femoral neck or spine after appropriate evaluation to exclude secondary causes C) Low bone mass (T-score between -1.0 and -2.5 at the femoral neck or spine) and a 10-year probability of a hip fracture ? 3% or a 10-year probability of a major osteoporosis-related fracture ? 20% based on the [place]-adapted WHO algorithm   5) Routine healthcare maintenance including cholesterol, diabetes screening discussed managed by PCP  6) Colonoscopy done in 2018.  Screening recommended starting at age 100 for  average risk individuals, age 42 for individuals deemed at increased risk (including African Americans) and recommended to continue until age 89.  For patient age 23-85 individualized approach is recommended.  Gold standard screening is via colonoscopy, Cologuard screening is an acceptable alternative for patient unwilling or unable to undergo colonoscopy.  "Colorectal cancer screening for average?risk adults: 2018 guideline update from the American Cancer Society"CA: A Cancer Journal for Clinicians: Dec 28, 2016   7) Return in about 1 year (around 01/31/2024).  8) Reviewed pt's concerns for estrogen therapy with Dr Logan Bores. Per Dr Logan Bores Estrogen is the preferred treatment for menopause, treatment supports bone and cardiovascular health, recommend remaining on estrogen. Pt in agreement. Will revisit stopping estrogen at next annual exam.   Carie Caddy, CNM  [facility] 01/31/2023, 12:20 PM

## 2023-02-01 ENCOUNTER — Other Ambulatory Visit: Payer: Self-pay | Admitting: Cardiovascular Disease

## 2023-02-14 ENCOUNTER — Ambulatory Visit: Payer: BC Managed Care – PPO | Admitting: Family

## 2023-02-14 VITALS — BP 110/80 | HR 69 | Ht 65.0 in | Wt 157.4 lb

## 2023-02-14 DIAGNOSIS — Z0000 Encounter for general adult medical examination without abnormal findings: Secondary | ICD-10-CM

## 2023-02-14 DIAGNOSIS — E559 Vitamin D deficiency, unspecified: Secondary | ICD-10-CM | POA: Diagnosis not present

## 2023-02-14 DIAGNOSIS — E538 Deficiency of other specified B group vitamins: Secondary | ICD-10-CM | POA: Diagnosis not present

## 2023-02-14 DIAGNOSIS — R5383 Other fatigue: Secondary | ICD-10-CM

## 2023-02-14 DIAGNOSIS — I10 Essential (primary) hypertension: Secondary | ICD-10-CM

## 2023-02-14 DIAGNOSIS — R7303 Prediabetes: Secondary | ICD-10-CM

## 2023-02-14 DIAGNOSIS — E782 Mixed hyperlipidemia: Secondary | ICD-10-CM

## 2023-02-14 NOTE — Progress Notes (Signed)
Complete physical exam  Patient: Alexandria Anderson   DOB: Dec 14, 1965   57 y.o. Female  MRN: [id number]  Subjective:    Chief Complaint  Patient presents with   Annual Exam    CPE    Alexandria Anderson is a 57 y.o. female who presents today for a complete physical exam. She reports consuming a general diet.  She generally feels well. She reports sleeping well. She does not have additional problems to discuss today.      Past Medical History:  Diagnosis Date   Allergy    Anemia    Asthma    as a child   GERD (gastroesophageal reflux disease)    PONV (postoperative nausea and vomiting)    after first foot surgery   Vitamin D deficiency     Past Surgical History:  Procedure Laterality Date   ABDOMINAL HYSTERECTOMY     COLONOSCOPY WITH PROPOFOL N/A 10/07/2016   Procedure: COLONOSCOPY WITH PROPOFOL;  Surgeon: Midge Minium, MD;  Location: [facility];  Service: Endoscopy;  Laterality: N/A;   CYSTOSCOPY N/A 02/03/2015   Procedure: CYSTOSCOPY;  Surgeon: Conard Novak, MD;  Location: [facility];  Service: Gynecology;  Laterality: N/A;   ESOPHAGOGASTRODUODENOSCOPY (EGD) WITH PROPOFOL N/A 04/29/2021   Procedure: ESOPHAGOGASTRODUODENOSCOPY (EGD) WITH PROPOFOL;  Surgeon: Toney Reil, MD;  Location: [facility];  Service: Gastroenterology;  Laterality: N/A;   HAMMER TOE SURGERY Bilateral    LAPAROSCOPIC HYSTERECTOMY N/A 02/03/2015   Procedure: HYSTERECTOMY TOTAL LAPAROSCOPIC/BILATERAL SALPINGECTOMY;  Surgeon: Conard Novak, MD;  Location: [facility];  Service: Gynecology;  Laterality: N/A;    Family History  Problem Relation Age of Onset   Hypertension Mother    Hypercholesterolemia Mother    Hyperlipidemia Mother    Heart disease Mother    Alcohol abuse Father    Cirrhosis Father    Breast cancer Neg Hx     Social History   Socioeconomic History   Marital status: Married    Spouse name: Not on file   Number of children: Not on file   Years of education: Not on  file   Highest education level: Not on file  Occupational History   Not on file  Tobacco Use   Smoking status: Never   Smokeless tobacco: Never  Vaping Use   Vaping status: Never Used  Substance and Sexual Activity   Alcohol use: Yes    Alcohol/week: 2.0 standard drinks of alcohol    Types: 2 Glasses of wine per week    Comment:     Drug use: No   Sexual activity: Yes    Birth control/protection: Post-menopausal, Surgical  Other Topics Concern   Not on file  Social History Narrative   Not on file   Social Determinants of Health   Financial Resource Strain: Not on file  Food Insecurity: Not on file  Transportation Needs: Not on file  Physical Activity: Sufficiently Active (11/14/2017)   Exercise Vital Sign    Days of Exercise per Week: 3 days    Minutes of Exercise per Session: 60 min  Stress: Stress Concern Present (11/14/2017)   [employer] of Occupational Health - Occupational Stress Questionnaire    Feeling of Stress : To some extent  Social Connections: Not on file  Intimate Partner Violence: Not on file    Outpatient Medications Prior to Visit  Medication Sig   baclofen (LIORESAL) 10 MG tablet Take 1 tablet (10 mg total) by mouth 3 (three) times daily as  needed for muscle spasms. For muscle spasms   estradiol (VIVELLE-DOT) 0.075 MG/24HR APPLY 1 PATCH TOPICALLY TWICE A WEEK   fluconazole (DIFLUCAN) 150 MG tablet TAKE 1 TABLET BY MOUTH ONE TIME PER WEEK   metoprolol succinate (TOPROL-XL) 25 MG 24 hr tablet TAKE 1 TABLET BY MOUTH EVERY DAY   RABEprazole (ACIPHEX) 20 MG tablet Take 1 tablet (20 mg total) by mouth 2 (two) times daily before a meal.   SOOLANTRA 1 % CREA    triamcinolone ointment (KENALOG) 0.1 % Apply topically 2 (two) times daily as needed.   No facility-administered medications prior to visit.    Review of Systems  All other systems reviewed and are negative.       Objective:     BP 110/80   Pulse 69   Ht 5\' 5"  (1.651 m)   Wt 157 lb  6.4 oz (71.4 kg)   LMP 12/30/2014 (Approximate)   SpO2 98%   BMI 26.19 kg/m    Physical Exam Vitals and nursing note reviewed.  Constitutional:      Appearance: Normal appearance. She is normal weight.  HENT:     Head: Normocephalic and atraumatic.     Nose: Nose normal.  Eyes:     Extraocular Movements: Extraocular movements intact.     Conjunctiva/sclera: Conjunctivae normal.     Pupils: Pupils are equal, round, and reactive to light.  Cardiovascular:     Rate and Rhythm: Normal rate and regular rhythm.  Pulmonary:     Effort: Pulmonary effort is normal.  Musculoskeletal:        General: Normal range of motion.  Neurological:     General: No focal deficit present.     Mental Status: She is alert. Mental status is at baseline.  Psychiatric:        Mood and Affect: Mood normal.        Behavior: Behavior normal.        Thought Content: Thought content normal.        Judgment: Judgment normal.      No results found for any visits on 02/14/23.  No results found for this or any previous visit (from the past 2160 hour(s)).      Assessment & Plan:    Routine Health Maintenance and Physical Exam  Immunization History  Administered Date(s) Administered   Pfizer Covid-19 Vaccine Bivalent Booster 72yrs & up 05/18/2022   Tdap 09/25/2015   Health Maintenance  Topic Date Due   Zoster Vaccines- Shingrix (1 of 2) Never done   COVID-19 Vaccine (2 - 2023-24 season) 07/13/2022   INFLUENZA VACCINE  03/02/2023   MAMMOGRAM  05/26/2024   PAP SMEAR-Modifier  12/15/2024   DTaP/Tdap/Td (2 - Td or Tdap) 09/24/2025   Colonoscopy  10/08/2026   Hepatitis C Screening  Completed   HIV Screening  Completed   HPV VACCINES  Aged Out    Discussed health benefits of physical activity, and encouraged her to engage in regular exercise appropriate for her age and condition.  Problem List Items Addressed This Visit   None Visit Diagnoses     B12 deficiency due to diet    -  Primary    Relevant Orders   CBC With Differential   CMP14+EGFR   Vitamin B12   Vitamin D deficiency, unspecified       Relevant Orders   VITAMIN D 25 Hydroxy (Vit-D Deficiency, Fractures)   CBC With Differential   CMP14+EGFR   Prediabetes  Relevant Orders   CBC With Differential   CMP14+EGFR   Hemoglobin A1c   Mixed hyperlipidemia       Relevant Orders   Lipid panel   CBC With Differential   CMP14+EGFR   Essential hypertension, benign       Relevant Orders   CBC With Differential   CMP14+EGFR   Other fatigue       Relevant Orders   CBC With Differential   CMP14+EGFR   TSH   Routine general medical examination at a health care facility           Will get labs today, for annual physical.  She is up to date on her preventatives and feels well in genearl, so we will continue current POC.   Return in about 6 months (around 08/17/2023).     Miki Kins, FNP  02/14/2023   This document may have been prepared by Dragon Voice Recognition software and as such may include unintentional dictation errors.

## 2023-02-19 ENCOUNTER — Encounter: Payer: Self-pay | Admitting: Family

## 2023-02-19 NOTE — Patient Instructions (Signed)

## 2023-02-28 ENCOUNTER — Telehealth: Payer: Self-pay | Admitting: Family

## 2023-02-28 NOTE — Telephone Encounter (Signed)
Patient left VM wanting lab results. Results printed for your review.

## 2023-03-07 ENCOUNTER — Telehealth: Payer: Self-pay

## 2023-03-07 NOTE — Telephone Encounter (Signed)
Per Marchelle Folks verbal, patient needs vitamin d supplement and must watch her carbs as she is offically falling into the diabetic range

## 2023-03-13 ENCOUNTER — Other Ambulatory Visit: Payer: Self-pay

## 2023-03-13 MED ORDER — VITAMIN D (ERGOCALCIFEROL) 1.25 MG (50000 UNIT) PO CAPS: 50000.0000 [IU] | ORAL_CAPSULE | ORAL | 3 refills | Status: DC

## 2023-03-13 NOTE — Telephone Encounter (Signed)
Rx was sent to [facility]

## 2023-06-19 ENCOUNTER — Other Ambulatory Visit: Payer: Self-pay | Admitting: Licensed Practical Nurse

## 2023-06-19 ENCOUNTER — Encounter: Payer: Self-pay | Admitting: Licensed Practical Nurse

## 2023-06-19 DIAGNOSIS — Z1231 Encounter for screening mammogram for malignant neoplasm of breast: Secondary | ICD-10-CM

## 2023-07-25 ENCOUNTER — Other Ambulatory Visit: Payer: Self-pay | Admitting: Family

## 2023-07-27 ENCOUNTER — Other Ambulatory Visit: Payer: Self-pay | Admitting: Family

## 2023-07-27 DIAGNOSIS — M542 Cervicalgia: Secondary | ICD-10-CM

## 2023-08-01 ENCOUNTER — Other Ambulatory Visit: Payer: Self-pay | Admitting: Cardiovascular Disease

## 2023-08-01 ENCOUNTER — Ambulatory Visit
Admission: RE | Admit: 2023-08-01 | Discharge: 2023-08-01 | Disposition: A | Discharge status: Home / Self Care | Payer: BC Managed Care – PPO | Source: Ambulatory Visit | Attending: Licensed Practical Nurse | Admitting: Licensed Practical Nurse

## 2023-08-01 DIAGNOSIS — Z1231 Encounter for screening mammogram for malignant neoplasm of breast: Secondary | ICD-10-CM | POA: Insufficient documentation

## 2024-01-21 ENCOUNTER — Other Ambulatory Visit: Payer: Self-pay | Admitting: Cardiovascular Disease

## 2024-01-30 ENCOUNTER — Ambulatory Visit (INDEPENDENT_AMBULATORY_CARE_PROVIDER_SITE_OTHER): Admitting: Podiatry

## 2024-01-30 DIAGNOSIS — M21961 Unspecified acquired deformity of right lower leg: Secondary | ICD-10-CM | POA: Diagnosis not present

## 2024-01-30 DIAGNOSIS — M21962 Unspecified acquired deformity of left lower leg: Secondary | ICD-10-CM

## 2024-01-30 NOTE — Progress Notes (Signed)
 Subjective:  Patient ID: Alexandria Anderson, female    DOB: 08/11/1965,  MRN: [id number]  Chief Complaint  Patient presents with   Toe Pain    Right foot toe pain     58 y.o. female presents with the above complaint.  Patient presents with bilateral flatfoot deformity.  She wanted to discuss treatment options for this.  She states that she works from home but does a lot of stuff on her feet.  She does not wear any good shoes.  She mostly wears Nike's.  I discussed shoe gear modification with her.  She is noticing some arch achiness to her feet.  Denies any other acute complaints she does not wear any orthotics   Review of Systems: Negative except as noted in the HPI. Denies N/V/F/Ch.  Past Medical History:  Diagnosis Date   Allergy    Anemia    Asthma    as a child   GERD (gastroesophageal reflux disease)    PONV (postoperative nausea and vomiting)    after first foot surgery   Vitamin D  deficiency     Current Outpatient Medications:    baclofen  (LIORESAL ) 10 MG tablet, TAKE 1 TABLET BY MOUTH THREE TIMES A DAY AS NEEDED FOR MUSCLE SPASMS/BACK PAIN, Disp: 270 tablet, Rfl: 1   estradiol  (VIVELLE -DOT) 0.075 MG/24HR, APPLY 1 PATCH TOPICALLY TWICE A WEEK, Disp: 24 patch, Rfl: 4   fluconazole  (DIFLUCAN ) 150 MG tablet, TAKE 1 TABLET BY MOUTH ONE TIME PER WEEK, Disp: 12 tablet, Rfl: 6   metoprolol succinate (TOPROL-XL) 25 MG 24 hr tablet, TAKE 1 TABLET BY MOUTH EVERY DAY, Disp: 90 tablet, Rfl: 1   omeprazole (PRILOSEC) 20 MG capsule, TAKE 1 CAPSULE BY MOUTH TWICE A DAY, Disp: 180 capsule, Rfl: 3   SOOLANTRA 1 % CREA, , Disp: , Rfl:    triamcinolone ointment (KENALOG) 0.1 %, Apply topically 2 (two) times daily as needed., Disp: , Rfl:    Vitamin D , Ergocalciferol , (DRISDOL ) 1.25 MG (50000 UNIT) CAPS capsule, Take 1 capsule (50,000 Units total) by mouth every 7 (seven) days., Disp: 12 capsule, Rfl: 3  Social History   Tobacco Use  Smoking Status Never  Smokeless Tobacco Never     Allergies  Allergen Reactions   Prednisone  Palpitations   Objective:  There were no vitals filed for this visit. There is no height or weight on file to calculate BMI. Constitutional Well developed. Well nourished.  Vascular Dorsalis pedis pulses palpable bilaterally. Posterior tibial pulses palpable bilaterally. Capillary refill normal to all digits.  No cyanosis or clubbing noted. Pedal hair growth normal.  Neurologic Normal speech. Oriented to person, place, and time. Epicritic sensation to light touch grossly present bilaterally.  Dermatologic Nails well groomed and normal in appearance. No open wounds. No skin lesions.  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   Radiographs: None Assessment:  No diagnosis found. Plan:  Patient was evaluated and treated and all questions answered.  Foot deformity bilateral -I explained to patient the etiology of pes planovalgus and relationship with Planter fasciitis and various treatment options were discussed.  Given patient foot structure in the setting of Planter fasciitis I believe patient will benefit from custom-made orthotics to help control the hindfoot motion support the arch of the foot and take the stress away from plantar fascial.  Patient agrees with the plan like to proceed  with orthotics -Patient was casted for orthotics  -  No follow-ups on file.

## 2024-02-09 ENCOUNTER — Telehealth: Payer: Self-pay | Admitting: Family

## 2024-02-09 DIAGNOSIS — N760 Acute vaginitis: Secondary | ICD-10-CM

## 2024-02-09 MED ORDER — FLUCONAZOLE 150 MG PO TABS: 150.0000 mg | ORAL_TABLET | Freq: Every day | ORAL | 0 refills | Status: AC

## 2024-02-09 NOTE — Telephone Encounter (Signed)
 Patient left VM requesting an Rx for a yeast infection be sent in for her. States that she has tried [employer] of Staff and it is not helping. Patient has not been seen in a year. Please advise on if she needs an appt or you will send something in.

## 2024-02-19 NOTE — Telephone Encounter (Signed)
 Already sent.

## 2024-02-21 ENCOUNTER — Telehealth: Payer: Self-pay

## 2024-02-21 NOTE — Telephone Encounter (Signed)
 Orthotics in [place] bin

## 2024-02-21 NOTE — Telephone Encounter (Signed)
 Called to schedule orthotic fitting.. VM box is full  Balance: $0

## 2024-03-05 ENCOUNTER — Other Ambulatory Visit

## 2024-03-19 ENCOUNTER — Other Ambulatory Visit

## 2024-03-26 ENCOUNTER — Other Ambulatory Visit: Payer: Self-pay | Admitting: Licensed Practical Nurse

## 2024-03-26 DIAGNOSIS — Z5181 Encounter for therapeutic drug level monitoring: Secondary | ICD-10-CM

## 2024-04-09 ENCOUNTER — Encounter: Payer: Self-pay | Admitting: Licensed Practical Nurse

## 2024-04-09 ENCOUNTER — Ambulatory Visit: Admitting: Licensed Practical Nurse

## 2024-04-09 VITALS — BP 157/92 | HR 65 | Ht 65.0 in | Wt 158.9 lb

## 2024-04-09 DIAGNOSIS — R030 Elevated blood-pressure reading, without diagnosis of hypertension: Secondary | ICD-10-CM

## 2024-04-09 DIAGNOSIS — Z01419 Encounter for gynecological examination (general) (routine) without abnormal findings: Secondary | ICD-10-CM | POA: Diagnosis not present

## 2024-04-09 NOTE — Progress Notes (Signed)
 Gynecology Annual Exam  PCP: Orlean Alan HERO, FNP  Chief Complaint:  Chief Complaint  Patient presents with   Gynecologic Exam    History of Present Illness:Patient is a 58 y.o. H6E8978 presents for annual exam. The patient has no complaints today. Denies any changes to her medical history since her last visit. She is on the estrogen patch, she continues to get night sweats but not as bad wonders if she is on the correct dose. Also experiences brain fog. These symptoms are not frequent.    LMP: Patient's last menstrual period was 12/30/2014 (approximate). S/p hysterectomy 10-11 years ago      The patient is sexually active with 1 female partner. She denies dyspareunia.  The patient does perform self breast exams.  There is no notable family history of breast or ovarian cancer in her family.   The patient wears seatbelts: yes.   The patient has regular exercise: yes. Goes to the gym 3 to 4 times per week, does cardio and strength training     The patient denies current symptoms of depression.   Admits to being under a lot of stress recently d/t a situation going on with her friend group, which she does want to discuss  Does have anxiety which has improved since last visit, now sees a therapist which is very helpful   Works as the Film/video editor of [employer] Lives with her husband, feels safe. 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   Climacteric 08/16/2017   Blood in stool     Past Surgical History:  Past Surgical History:   Procedure Laterality Date   ABDOMINAL HYSTERECTOMY     COLONOSCOPY WITH PROPOFOL  N/A 10/07/2016   Procedure: COLONOSCOPY WITH PROPOFOL ;  Surgeon: Rogelia Copping, MD;  Location: [facility];  Service: Endoscopy;  Laterality: N/A;   CYSTOSCOPY N/A 02/03/2015   Procedure: CYSTOSCOPY;  Surgeon: Garnette JONETTA Mace, MD;  Location: [facility];  Service: Gynecology;  Laterality: N/A;   ESOPHAGOGASTRODUODENOSCOPY (EGD) WITH PROPOFOL  N/A 04/29/2021   Procedure: ESOPHAGOGASTRODUODENOSCOPY (EGD) WITH PROPOFOL ;  Surgeon: Unk Corinn Skiff, MD;  Location[facility];  Service: Gastroenterology;  Laterality: N/A;   HAMMER TOE SURGERY Bilateral    LAPAROSCOPIC HYSTERECTOMY N/A 02/03/2015   Procedure: HYSTERECTOMY TOTAL LAPAROSCOPIC/BILATERAL SALPINGECTOMY;  Surgeon: Garnette JONETTA Mace, MD;  Location: [facility] OR[facility];  Service: Gynecology;  Laterality: N/A;    Gynecologic History:  Patient's last menstrual period was 12/30/2014 (approximate). Last Pap: Results were: 2023 NIL and HR HPV negative  Last mammogram: 07/2023 Results were: BI-RAD I  Obstetric History: [id number]  Family History:  Family History  Problem Relation Age of Onset   Hypertension Mother    Hypercholesterolemia Mother    Hyperlipidemia Mother    Heart disease Mother    Alcohol abuse Father    Cirrhosis Father    Breast cancer Neg Hx     Social History:  Social History   Socioeconomic History   Marital status: Married    Spouse  name: Not on file   Number of children: Not on file   Years of education: Not on file   Highest education level: Not on file  Occupational History   Not on file  Tobacco Use   Smoking status: Never   Smokeless tobacco: Never  Vaping Use   Vaping status: Never Used  Substance and Sexual Activity   Alcohol use: Yes    Alcohol/week: 2.0 standard drinks of alcohol    Types: 2 Glasses of wine per week    Comment:     Drug use: No   Sexual activity: Yes    Birth control/protection: Post-menopausal,  Surgical  Other Topics Concern   Not on file  Social History Narrative   Not on file   Social Drivers of Health   Financial Resource Strain: Not on file  Food Insecurity: Not on file  Transportation Needs: Not on file  Physical Activity: Sufficiently Active (11/14/2017)   Exercise Vital Sign    Days of Exercise per Week: 3 days    Minutes of Exercise per Session: 60 min  Stress: Stress Concern Present (11/14/2017)   [employer] of Occupational Health - Occupational Stress Questionnaire    Feeling of Stress : To some extent  Social Connections: Not on file  Intimate Partner Violence: Not on file    Allergies:  Allergies  Allergen Reactions   Prednisone  Palpitations    Medications: Prior to Admission medications   Medication Sig Start Date End Date Taking? Authorizing Provider  baclofen  (LIORESAL ) 10 MG tablet TAKE 1 TABLET BY MOUTH THREE TIMES A DAY AS NEEDED FOR MUSCLE SPASMS/BACK PAIN 07/31/23  Yes Orlean Alan HERO, FNP  LYLLANA  0.075 MG/24HR APPLY 1 PATCH TO THE SKIN 2 TIMES A WEEK 03/26/24  Yes Arianie Couse, Jinnie Jansky, CNM  metoprolol succinate (TOPROL-XL) 25 MG 24 hr tablet TAKE 1 TABLET BY MOUTH EVERY DAY 01/22/24  Yes Fernand Denyse LABOR, MD  omeprazole (PRILOSEC) 20 MG capsule TAKE 1 CAPSULE BY MOUTH TWICE A DAY 07/25/23  Yes Orlean Alan HERO, FNP  SOOLANTRA 1 % CREA  11/23/18  Yes [provider]  triamcinolone ointment (KENALOG) 0.1 % Apply topically 2 (two) times daily as needed. 03/22/21  Yes [provider]  Vitamin D , Ergocalciferol , (DRISDOL ) 1.25 MG (50000 UNIT) CAPS capsule Take 1 capsule (50,000 Units total) by mouth every 7 (seven) days. 03/13/23  Yes Orlean Alan HERO, FNP    Physical Exam Vitals: Blood pressure (!) 158/84, pulse 69, height 5' 5 (1.651 m), weight 158 lb 14.4 oz (72.1 kg), last menstrual period 12/30/2014.  General: NAD HEENT: normocephalic, anicteric Thyroid: no enlargement, no palpable nodules Pulmonary: No increased work  of breathing, CTAB Cardiovascular: RRR, distal pulses 2+ Breast: Breast symmetrical, no tenderness, no palpable nodules or masses, no skin or nipple retraction present, no nipple discharge.  No axillary or supraclavicular lymphadenopathy. Abdomen: NABS, soft, non-tender, non-distended.  Umbilicus without lesions.  No hepatomegaly, splenomegaly or masses palpable. No evidence of hernia  Genitourinary:  External: Normal external female genitalia.  Normal urethral meatus, normal Bartholin's and Skene's glands.    Vagina: Normal vaginal mucosa, no evidence of prolapse.    Cervix: surgically absent   Uterus: surgically absent   Adnexa: ovaries non-enlarged, no adnexal masses  Rectal: deferred  Lymphatic: no evidence of inguinal lymphadenopathy Extremities: no edema, erythema, or tenderness Neurologic: Grossly intact Psychiatric: mood appropriate, affect full    Assessment: 58 y.o. H6E8978 routine annual exam  Plan: Problem List Items Addressed This Visit  None Visit Diagnoses       Well woman exam    -  Primary     Elevated BP without diagnosis of hypertension           1) Mammogram - recommend yearly screening mammogram.  Mammogram Was ordered today  2) STI screening  wasoffered and declined  3) ASCCP guidelines and rational discussed.  Patient opts for every 5 years screening interval  4) Osteoporosis  - per USPTF routine screening DEXA at age 63   Consider FDA-approved medical therapies in postmenopausal women and men aged 46 years and older, based on the following: a) A hip or vertebral (clinical or morphometric) fracture b) T-score <= -2.5 at the femoral neck or spine after appropriate evaluation to exclude secondary causes C) Low bone mass (T-score between -1.0 and -2.5 at the femoral neck or spine) and a 10-year probability of a hip fracture >= 3% or a 10-year probability of a major osteoporosis-related fracture >= 20% based on the [place] -adapted WHO algorithm   5)  Routine healthcare maintenance including cholesterol, diabetes screening discussed managed by PCP  6) Colonoscopy up to date 2018 .  Screening recommended starting at age 25 for average risk individuals, age 8 for individuals deemed at increased risk (including African Americans) and recommended to continue until age 44.  For patient age 31-85 individualized approach is recommended.  Gold standard screening is via colonoscopy, Cologuard screening is an acceptable alternative for patient unwilling or unable to undergo colonoscopy.  Colorectal cancer screening for average?risk adults: 2018 guideline update from the American Cancer SocietyCA: A Cancer Journal for Clinicians: Dec 28, 2016   7) Elevated  BP Darinda feels this reading is related to the stress she is under, asymptomatic today, she does have a BP cuff at home. Rec checking BP once or twice a day and bringing that log with her to her PCP next week. If you BP's readings are consistently high or you experience severe headache or general feeling unwell please go to the  ED.     Jinnie Cookey, CNM  [facility] 04/09/24  4:36 PM

## 2024-04-11 LAB — BASIC METABOLIC PANEL WITH GFR
BUN: 11 (ref 4–21)
CO2: 20 (ref 13–22)
Chloride: 103 (ref 99–108)
Creatinine: 0.7 (ref 0.5–1.1)
Glucose: 78
Potassium: 4.6 meq/L (ref 3.5–5.1)
Sodium: 139 (ref 137–147)

## 2024-04-11 LAB — HEPATIC FUNCTION PANEL
ALT: 83 U/L — AB (ref 7–35)
AST: 34 (ref 13–35)
Alkaline Phosphatase: 93 (ref 25–125)
Bilirubin, Total: 0.6

## 2024-04-11 LAB — CBC AND DIFFERENTIAL
HCT: 38 (ref 36–46)
Hemoglobin: 11.8 — AB (ref 12.0–16.0)
Neutrophils Absolute: 3.4
Platelets: 221 K/uL (ref 150–400)
WBC: 6.4

## 2024-04-11 LAB — LIPID PANEL
Cholesterol: 158 (ref 0–200)
HDL: 50 (ref 35–70)
LDL Cholesterol: 97
LDl/HDL Ratio: 3.2
Triglycerides: 55 (ref 40–160)

## 2024-04-11 LAB — COMPREHENSIVE METABOLIC PANEL WITH GFR
Albumin: 4.7 (ref 3.5–5.0)
Calcium: 9.2 (ref 8.7–10.7)
Globulin: 2.4
eGFR: 99

## 2024-04-11 LAB — VITAMIN D 25 HYDROXY (VIT D DEFICIENCY, FRACTURES): Vit D, 25-Hydroxy: 23.6

## 2024-04-11 LAB — HEMOGLOBIN A1C: Hemoglobin A1C: 5.8

## 2024-04-11 LAB — CBC: RBC: 4.25 (ref 3.87–5.11)

## 2024-04-11 LAB — TSH: TSH: 1.05 (ref 0.41–5.90)

## 2024-04-15 ENCOUNTER — Encounter: Payer: Self-pay | Admitting: Cardiovascular Disease

## 2024-04-15 ENCOUNTER — Ambulatory Visit (INDEPENDENT_AMBULATORY_CARE_PROVIDER_SITE_OTHER): Admitting: Cardiovascular Disease

## 2024-04-15 VITALS — BP 122/84 | HR 54 | Ht 65.0 in | Wt 153.2 lb

## 2024-04-15 DIAGNOSIS — R5383 Other fatigue: Secondary | ICD-10-CM

## 2024-04-15 DIAGNOSIS — R001 Bradycardia, unspecified: Secondary | ICD-10-CM

## 2024-04-15 DIAGNOSIS — R0602 Shortness of breath: Secondary | ICD-10-CM

## 2024-04-15 DIAGNOSIS — I10 Essential (primary) hypertension: Secondary | ICD-10-CM

## 2024-04-15 DIAGNOSIS — E782 Mixed hyperlipidemia: Secondary | ICD-10-CM

## 2024-04-15 NOTE — Progress Notes (Signed)
 Cardiology Office Note   Date:  04/15/2024   ID:  Alexandria Anderson, DOB July 14, 1966, MRN [id number]  PCP:  Alexandria Alan HERO, FNP  Cardiologist:  Denyse Bathe, MD      History of Present Illness: Alexandria Anderson is a 58 y.o. female who presents for  Chief Complaint  Patient presents with   Follow-up    Occasional palpitations.      Past Medical History:  Diagnosis Date   Allergy    Anemia    Asthma    as a child   GERD (gastroesophageal reflux disease)    PONV (postoperative nausea and vomiting)    after first foot surgery   Vitamin D  deficiency      Past Surgical History:  Procedure Laterality Date   ABDOMINAL HYSTERECTOMY     COLONOSCOPY WITH PROPOFOL  N/A 10/07/2016   Procedure: COLONOSCOPY WITH PROPOFOL ;  Surgeon: Rogelia Copping, MD;  Location: [facility];  Service: Endoscopy;  Laterality: N/A;   CYSTOSCOPY N/A 02/03/2015   Procedure: CYSTOSCOPY;  Surgeon: Garnette JONETTA Mace, MD;  Location: [facility];  Service: Gynecology;  Laterality: N/A;   ESOPHAGOGASTRODUODENOSCOPY (EGD) WITH PROPOFOL  N/A 04/29/2021   Procedure: ESOPHAGOGASTRODUODENOSCOPY (EGD) WITH PROPOFOL ;  Surgeon: Unk Corinn Skiff, MD;  Location: [facility] ENDOSCOPY;  Service: Gastroenterology;  Laterality: N/A;   HAMMER TOE SURGERY Bilateral    LAPAROSCOPIC HYSTERECTOMY N/A 02/03/2015   Procedure: HYSTERECTOMY TOTAL LAPAROSCOPIC/BILATERAL SALPINGECTOMY;  Surgeon: Garnette JONETTA Mace, MD;  Location: [facility];  Service: Gynecology;  Laterality: N/A;     Current Outpatient Medications  Medication Sig Dispense Refill   baclofen  (LIORESAL ) 10 MG tablet TAKE 1 TABLET BY MOUTH THREE TIMES A DAY AS NEEDED FOR MUSCLE SPASMS/BACK PAIN (Patient taking differently: as needed.) 270 tablet 1   LYLLANA  0.075 MG/24HR APPLY 1 PATCH TO THE SKIN 2 TIMES A WEEK 24 patch 4   metoprolol succinate (TOPROL-XL) 25 MG 24 hr tablet TAKE 1 TABLET BY MOUTH EVERY DAY 90 tablet 1   omeprazole (PRILOSEC) 20 MG capsule TAKE 1 CAPSULE BY  MOUTH TWICE A DAY 180 capsule 3   SOOLANTRA 1 % CREA      triamcinolone ointment (KENALOG) 0.1 % Apply topically 2 (two) times daily as needed.     No current facility-administered medications for this visit.    Allergies:   Prednisone     Social History:   reports that she has never smoked. She has never used smokeless tobacco. She reports current alcohol use of about 2.0 standard drinks of alcohol per week. She reports that she does not use drugs.   Family History:  family history includes Alcohol abuse in her father; Cirrhosis in her father; Heart disease in her mother; Hypercholesterolemia in her mother; Hyperlipidemia in her mother; Hypertension in her mother.    ROS:     Review of Systems  Constitutional: Negative.   HENT: Negative.    Eyes: Negative.   Respiratory: Negative.    Gastrointestinal: Negative.   Genitourinary: Negative.   Musculoskeletal: Negative.   Skin: Negative.   Neurological: Negative.   Endo/Heme/Allergies: Negative.   Psychiatric/Behavioral: Negative.    All other systems reviewed and are negative.     All other systems are reviewed and negative.    PHYSICAL EXAM: VS:  BP 122/84   Pulse (!) 54   Ht 5' 5 (1.651 m)   Wt 153 lb 3.2 oz (69.5 kg)   LMP 12/30/2014 (Approximate)   SpO2 98%   BMI 25.49 kg/m  ,  BMI Body mass index is 25.49 kg/m. Last weight:  Wt Readings from Last 3 Encounters:  04/15/24 153 lb 3.2 oz (69.5 kg)  04/09/24 158 lb 14.4 oz (72.1 kg)  02/14/23 157 lb 6.4 oz (71.4 kg)     Physical Exam Constitutional:      Appearance: Normal appearance.  Cardiovascular:     Rate and Rhythm: Normal rate and regular rhythm.     Heart sounds: Normal heart sounds.  Pulmonary:     Effort: Pulmonary effort is normal.     Breath sounds: Normal breath sounds.  Musculoskeletal:     Right lower leg: No edema.     Left lower leg: No edema.  Neurological:     Mental Status: She is alert.       EKG:   Recent Labs: No results  found for requested labs within last 365 days.    Lipid Panel    Component Value Date/Time   CHOL 154 04/14/2020 0838   TRIG 79 04/14/2020 0838   HDL 48 04/14/2020 0838   CHOLHDL 3.2 04/14/2020 0838   LDLCALC 91 04/14/2020 0838      Other studies Reviewed: Additional studies/ records that were reviewed today include:  Review of the above records demonstrates:       No data to display            ASSESSMENT AND PLAN:    ICD-10-CM   1. Other fatigue  R53.83 PCV ECHOCARDIOGRAM COMPLETE    MYOCARDIAL PERFUSION IMAGING    2. Essential hypertension, benign  I10 PCV ECHOCARDIOGRAM COMPLETE    MYOCARDIAL PERFUSION IMAGING    3. Mixed hyperlipidemia  E78.2 PCV ECHOCARDIOGRAM COMPLETE    MYOCARDIAL PERFUSION IMAGING    4. Sinus bradycardia  R00.1 PCV ECHOCARDIOGRAM COMPLETE    MYOCARDIAL PERFUSION IMAGING    5. SOB (shortness of breath)  R06.02 PCV ECHOCARDIOGRAM COMPLETE    MYOCARDIAL PERFUSION IMAGING   Has DOE, no testing done recently. Advise echo, stress test. Had mother having CAD , and want to be checked.       Problem List Items Addressed This Visit   None Visit Diagnoses       Other fatigue    -  Primary   Relevant Orders   PCV ECHOCARDIOGRAM COMPLETE   MYOCARDIAL PERFUSION IMAGING     Essential hypertension, benign       Relevant Orders   PCV ECHOCARDIOGRAM COMPLETE   MYOCARDIAL PERFUSION IMAGING     Mixed hyperlipidemia       Relevant Orders   PCV ECHOCARDIOGRAM COMPLETE   MYOCARDIAL PERFUSION IMAGING     Sinus bradycardia       Relevant Orders   PCV ECHOCARDIOGRAM COMPLETE   MYOCARDIAL PERFUSION IMAGING     SOB (shortness of breath)       Has DOE, no testing done recently. Advise echo, stress test. Had mother having CAD , and want to be checked.   Relevant Orders   PCV ECHOCARDIOGRAM COMPLETE   MYOCARDIAL PERFUSION IMAGING          Disposition:   Return in about 4 weeks (around 05/13/2024) for echo, stress test and f/u.    Total time  spent: 45 minutes  Signed,  Denyse Bathe, MD  04/15/2024 12:00 PM    [facility]

## 2024-04-16 ENCOUNTER — Ambulatory Visit (INDEPENDENT_AMBULATORY_CARE_PROVIDER_SITE_OTHER): Admitting: Family

## 2024-04-16 ENCOUNTER — Encounter: Payer: Self-pay | Admitting: Family

## 2024-04-16 VITALS — BP 128/84 | HR 65 | Ht 65.0 in | Wt 152.6 lb

## 2024-04-16 DIAGNOSIS — R7989 Other specified abnormal findings of blood chemistry: Secondary | ICD-10-CM

## 2024-04-16 DIAGNOSIS — R5383 Other fatigue: Secondary | ICD-10-CM

## 2024-04-16 DIAGNOSIS — Z0001 Encounter for general adult medical examination with abnormal findings: Secondary | ICD-10-CM | POA: Diagnosis not present

## 2024-04-16 DIAGNOSIS — Z0000 Encounter for general adult medical examination without abnormal findings: Secondary | ICD-10-CM

## 2024-04-16 DIAGNOSIS — Z0130 Encounter for examination of blood pressure without abnormal findings: Secondary | ICD-10-CM

## 2024-04-16 NOTE — Progress Notes (Unsigned)
 Complete physical exam  Patient: Alexandria Anderson   DOB: 04-Oct-1965   58 y.o. Female  MRN: [id number]  Subjective:    Chief Complaint  Patient presents with   Annual Exam    CPE    Alexandria Anderson is a 58 y.o. female who presents today for a complete physical exam. She reports consuming a general diet.  She generally feels fairly well. She reports sleeping fairly well. She does not have additional problems to discuss today.    Most recent fall risk assessment:     No data to display           Most recent depression screenings:     No data to display          Past Medical History:  Diagnosis Date   Allergy    Anemia    Asthma    as a child   GERD (gastroesophageal reflux disease)    PONV (postoperative nausea and vomiting)    after first foot surgery   Vitamin D  deficiency     Past Surgical History:  Procedure Laterality Date   ABDOMINAL HYSTERECTOMY     COLONOSCOPY WITH PROPOFOL  N/A 10/07/2016   Procedure: COLONOSCOPY WITH PROPOFOL ;  Surgeon: Rogelia Copping, MD;  Location: [facility];  Service: Endoscopy;  Laterality: N/A;   CYSTOSCOPY N/A 02/03/2015   Procedure: CYSTOSCOPY;  Surgeon: Garnette JONETTA Mace, MD;  Location: [facility] OR[facility];  Service: Gynecology;  Laterality: N/A;   ESOPHAGOGASTRODUODENOSCOPY (EGD) WITH PROPOFOL  N/A 04/29/2021   Procedure: ESOPHAGOGASTRODUODENOSCOPY (EGD) WITH PROPOFOL ;  Surgeon: Unk Corinn Skiff, MD;  Location: [facility] ENDOSCOPY;  Service: Gastroenterology;  Laterality: N/A;   HAMMER TOE SURGERY Bilateral    LAPAROSCOPIC HYSTERECTOMY N/A 02/03/2015   Procedure: HYSTERECTOMY TOTAL LAPAROSCOPIC/BILATERAL SALPINGECTOMY;  Surgeon: Garnette JONETTA Mace, MD;  Location: [facility] OR[facility];  Service: Gynecology;  Laterality: N/A;    Family History  Problem Relation Age of Onset   Hypertension Mother    Hypercholesterolemia Mother    Hyperlipidemia Mother    Heart disease Mother    Alcohol abuse Father    Cirrhosis Father    Breast cancer Neg Hx      Social History   Socioeconomic History   Marital status: Married    Spouse name: Not on file   Number of children: Not on file   Years of education: Not on file   Highest education level: Not on file  Occupational History   Not on file  Tobacco Use   Smoking status: Never   Smokeless tobacco: Never  Vaping Use   Vaping status: Never Used  Substance and Sexual Activity   Alcohol use: Yes    Alcohol/week: 2.0 standard drinks of alcohol    Types: 2 Glasses of wine per week    Comment:     Drug use: No   Sexual activity: Yes    Birth control/protection: Post-menopausal, Surgical  Other Topics Concern   Not on file  Social History Narrative   Not on file   Social Drivers of Health   Financial Resource Strain: Not on file  Food Insecurity: Not on file  Transportation Needs: Not on file  Physical Activity: Sufficiently Active (11/14/2017)   Exercise Vital Sign    Days of Exercise per Week: 3 days    Minutes of Exercise per Session: 60 min  Stress: Stress Concern Present (11/14/2017)   [employer] of Occupational Health - Occupational Stress Questionnaire    Feeling of Stress : To some extent  Social Connections: Not on file  Intimate Partner Violence: Not on file    Outpatient Medications Prior to Visit  Medication Sig   baclofen  (LIORESAL ) 10 MG tablet TAKE 1 TABLET BY MOUTH THREE TIMES A DAY AS NEEDED FOR MUSCLE SPASMS/BACK PAIN   LYLLANA  0.075 MG/24HR APPLY 1 PATCH TO THE SKIN 2 TIMES A WEEK   metoprolol succinate (TOPROL-XL) 25 MG 24 hr tablet TAKE 1 TABLET BY MOUTH EVERY DAY   omeprazole (PRILOSEC) 20 MG capsule TAKE 1 CAPSULE BY MOUTH TWICE A DAY   SOOLANTRA 1 % CREA    triamcinolone ointment (KENALOG) 0.1 % Apply topically 2 (two) times daily as needed.   No facility-administered medications prior to visit.    Review of Systems  All other systems reviewed and are negative.       Objective:     BP 128/84   Pulse 65   Ht 5' 5 (1.651 m)   Wt  152 lb 9.6 oz (69.2 kg)   LMP 12/30/2014 (Approximate)   SpO2 99%   BMI 25.39 kg/m    Physical Exam Vitals and nursing note reviewed.  Constitutional:      Appearance: Normal appearance. She is normal weight.  HENT:     Head: Normocephalic.  Eyes:     Extraocular Movements: Extraocular movements intact.     Conjunctiva/sclera: Conjunctivae normal.     Pupils: Pupils are equal, round, and reactive to light.  Cardiovascular:     Rate and Rhythm: Normal rate.  Pulmonary:     Effort: Pulmonary effort is normal.  Neurological:     General: No focal deficit present.     Mental Status: She is alert and oriented to person, place, and time. Mental status is at baseline.  Psychiatric:        Mood and Affect: Mood normal.        Behavior: Behavior normal.        Thought Content: Thought content normal.      No results found for any visits on 04/16/24.  No results found for this or any previous visit (from the past 2160 hours).      Assessment & Plan:    Routine Health Maintenance and Physical Exam  Immunization History  Administered Date(s) Administered   Pfizer Covid-19 Vaccine Bivalent Booster 68yrs & up 05/18/2022   Tdap 09/25/2015    Health Maintenance  Topic Date Due   Hepatitis B Vaccines 19-59 Average Risk (1 of 3 - 19+ 3-dose series) Never done   Pneumococcal Vaccine: 50+ Years (1 of 1 - PCV) Never done   Zoster Vaccines- Shingrix (1 of 2) Never done   Influenza Vaccine  Never done   COVID-19 Vaccine (2 - 2025-26 season) 04/01/2024   Mammogram  07/31/2025   DTaP/Tdap/Td (2 - Td or Tdap) 09/24/2025   Colonoscopy  10/08/2026   Cervical Cancer Screening (HPV/Pap Cotest)  12/16/2026   Hepatitis C Screening  Completed   HIV Screening  Completed   HPV VACCINES  Aged Out   Meningococcal B Vaccine  Aged Out    Discussed health benefits of physical activity, and encouraged her to engage in regular exercise appropriate for her age and condition.  Problem List  Items Addressed This Visit   None Visit Diagnoses       Abnormal CBC    -  Primary   Relevant Orders   Iron, TIBC and Ferritin Panel     Other fatigue       Relevant Orders  Vitamin B12      No follow-ups on file.     ALAN CHRISTELLA ARRANT, FNP  04/16/2024   This document may have been prepared by Dragon Voice Recognition software and as such may include unintentional dictation errors.

## 2024-05-05 ENCOUNTER — Other Ambulatory Visit: Payer: Self-pay | Admitting: Family

## 2024-05-05 DIAGNOSIS — M542 Cervicalgia: Secondary | ICD-10-CM

## 2024-05-10 ENCOUNTER — Ambulatory Visit (INDEPENDENT_AMBULATORY_CARE_PROVIDER_SITE_OTHER)

## 2024-05-10 DIAGNOSIS — E782 Mixed hyperlipidemia: Secondary | ICD-10-CM

## 2024-05-10 DIAGNOSIS — I10 Essential (primary) hypertension: Secondary | ICD-10-CM

## 2024-05-10 DIAGNOSIS — I361 Nonrheumatic tricuspid (valve) insufficiency: Secondary | ICD-10-CM

## 2024-05-10 DIAGNOSIS — I340 Nonrheumatic mitral (valve) insufficiency: Secondary | ICD-10-CM

## 2024-05-10 DIAGNOSIS — I371 Nonrheumatic pulmonary valve insufficiency: Secondary | ICD-10-CM

## 2024-05-10 DIAGNOSIS — R001 Bradycardia, unspecified: Secondary | ICD-10-CM

## 2024-05-10 DIAGNOSIS — R5383 Other fatigue: Secondary | ICD-10-CM

## 2024-05-10 DIAGNOSIS — R0602 Shortness of breath: Secondary | ICD-10-CM

## 2024-05-23 ENCOUNTER — Encounter

## 2024-05-27 ENCOUNTER — Ambulatory Visit: Admitting: Cardiovascular Disease

## 2024-05-30 ENCOUNTER — Encounter

## 2024-06-06 ENCOUNTER — Encounter

## 2024-06-11 ENCOUNTER — Ambulatory Visit: Admitting: Cardiovascular Disease

## 2024-06-20 ENCOUNTER — Ambulatory Visit (INDEPENDENT_AMBULATORY_CARE_PROVIDER_SITE_OTHER)

## 2024-06-20 DIAGNOSIS — R001 Bradycardia, unspecified: Secondary | ICD-10-CM | POA: Diagnosis not present

## 2024-06-20 DIAGNOSIS — I10 Essential (primary) hypertension: Secondary | ICD-10-CM

## 2024-06-20 DIAGNOSIS — R0602 Shortness of breath: Secondary | ICD-10-CM | POA: Diagnosis not present

## 2024-06-20 DIAGNOSIS — E782 Mixed hyperlipidemia: Secondary | ICD-10-CM

## 2024-06-20 DIAGNOSIS — R5383 Other fatigue: Secondary | ICD-10-CM

## 2024-06-25 ENCOUNTER — Encounter: Payer: Self-pay | Admitting: Cardiovascular Disease

## 2024-06-25 ENCOUNTER — Ambulatory Visit (INDEPENDENT_AMBULATORY_CARE_PROVIDER_SITE_OTHER): Admitting: Cardiovascular Disease

## 2024-06-25 VITALS — BP 125/78 | HR 67 | Ht 65.0 in | Wt 152.6 lb

## 2024-06-25 DIAGNOSIS — E782 Mixed hyperlipidemia: Secondary | ICD-10-CM | POA: Diagnosis not present

## 2024-06-25 DIAGNOSIS — Z131 Encounter for screening for diabetes mellitus: Secondary | ICD-10-CM

## 2024-06-25 DIAGNOSIS — I10 Essential (primary) hypertension: Secondary | ICD-10-CM | POA: Diagnosis not present

## 2024-06-25 DIAGNOSIS — R001 Bradycardia, unspecified: Secondary | ICD-10-CM | POA: Diagnosis not present

## 2024-06-25 DIAGNOSIS — R0602 Shortness of breath: Secondary | ICD-10-CM

## 2024-06-25 MED ORDER — ENALAPRIL MALEATE 2.5 MG PO TABS: 2.5000 mg | ORAL_TABLET | Freq: Every day | ORAL | 11 refills | Status: AC

## 2024-06-25 NOTE — Progress Notes (Signed)
 Cardiology Office Note   Date:  06/25/2024   ID:  Johnesha, Acheampong [birth year], MRN [id number]  PCP:  Orlean Alan HERO, FNP  Cardiologist:  Denyse Bathe, MD      History of Present Illness: Alexandria Anderson is a 58 y.o. female who presents for  Chief Complaint  Patient presents with   Follow-up    4 week NST/Echo results    Walks 4 times a week 3 miles.      Past Medical History:  Diagnosis Date   Allergy    Anemia    Asthma    as a child   GERD (gastroesophageal reflux disease)    PONV (postoperative nausea and vomiting)    after first foot surgery   Vitamin D  deficiency      Past Surgical History:  Procedure Laterality Date   ABDOMINAL HYSTERECTOMY     COLONOSCOPY WITH PROPOFOL  N/A 10/07/2016   Procedure: COLONOSCOPY WITH PROPOFOL ;  Surgeon: Rogelia Copping, MD;  Location: [facility];  Service: Endoscopy;  Laterality: N/A;   CYSTOSCOPY N/A 02/03/2015   Procedure: CYSTOSCOPY;  Surgeon: Garnette JONETTA Mace, MD;  Location: [facility];  Service: Gynecology;  Laterality: N/A;   ESOPHAGOGASTRODUODENOSCOPY (EGD) WITH PROPOFOL  N/A 04/29/2021   Procedure: ESOPHAGOGASTRODUODENOSCOPY (EGD) WITH PROPOFOL ;  Surgeon: Unk Corinn Skiff, MD;  Location: [facility];  Service: Gastroenterology;  Laterality: N/A;   HAMMER TOE SURGERY Bilateral    LAPAROSCOPIC HYSTERECTOMY N/A 02/03/2015   Procedure: HYSTERECTOMY TOTAL LAPAROSCOPIC/BILATERAL SALPINGECTOMY;  Surgeon: Garnette JONETTA Mace, MD;  Location: [facility];  Service: Gynecology;  Laterality: N/A;     Current Outpatient Medications  Medication Sig Dispense Refill   baclofen  (LIORESAL ) 10 MG tablet TAKE 1 TABLET BY MOUTH THREE TIMES A DAY AS NEEDED FOR MUSCLE SPASMS/BACK PAIN 270 tablet 1   enalapril  (VASOTEC ) 2.5 MG tablet Take 1 tablet (2.5 mg total) by mouth daily. 30 tablet 11   LYLLANA  0.075 MG/24HR APPLY 1 PATCH TO THE SKIN 2 TIMES A WEEK 24 patch 4   metoprolol succinate (TOPROL-XL) 25 MG 24 hr tablet TAKE 1 TABLET  BY MOUTH EVERY DAY 90 tablet 1   omeprazole (PRILOSEC) 20 MG capsule TAKE 1 CAPSULE BY MOUTH TWICE A DAY 180 capsule 3   SOOLANTRA 1 % CREA      triamcinolone ointment (KENALOG) 0.1 % Apply topically 2 (two) times daily as needed.     No current facility-administered medications for this visit.    Allergies:   Prednisone     Social History:   reports that she has never smoked. She has never used smokeless tobacco. She reports current alcohol use of about 2.0 standard drinks of alcohol per week. She reports that she does not use drugs.   Family History:  family history includes Alcohol abuse in her father; Cirrhosis in her father; Heart disease in her mother; Hypercholesterolemia in her mother; Hyperlipidemia in her mother; Hypertension in her mother.    ROS:     Review of Systems  Constitutional: Negative.   HENT: Negative.    Eyes: Negative.   Respiratory: Negative.    Gastrointestinal: Negative.   Genitourinary: Negative.   Musculoskeletal: Negative.   Skin: Negative.   Neurological: Negative.   Endo/Heme/Allergies: Negative.   Psychiatric/Behavioral: Negative.    All other systems reviewed and are negative.     All other systems are reviewed and negative.    PHYSICAL EXAM: VS:  BP 125/78   Pulse 67   Ht 5' 5 (1.651  m)   Wt 152 lb 9.6 oz (69.2 kg)   LMP 12/30/2014 (Approximate)   SpO2 99%   BMI 25.39 kg/m  , BMI Body mass index is 25.39 kg/m. Last weight:  Wt Readings from Last 3 Encounters:  06/25/24 152 lb 9.6 oz (69.2 kg)  04/16/24 152 lb 9.6 oz (69.2 kg)  04/15/24 153 lb 3.2 oz (69.5 kg)     Physical Exam Constitutional:      Appearance: Normal appearance.  Cardiovascular:     Rate and Rhythm: Normal rate and regular rhythm.     Heart sounds: Normal heart sounds.  Pulmonary:     Effort: Pulmonary effort is normal.     Breath sounds: Normal breath sounds.  Musculoskeletal:     Right lower leg: No edema.     Left lower leg: No edema.   Neurological:     Mental Status: She is alert.       EKG:   Recent Labs: 04/11/2024: ALT 83; BUN 11; Creatinine 0.7; Hemoglobin 11.8; Platelets 221; Potassium 4.6; Sodium 139; TSH 1.05    Lipid Panel    Component Value Date/Time   CHOL 158 04/11/2024 0000   CHOL 154 04/14/2020 0838   TRIG 55 04/11/2024 0000   HDL 50 04/11/2024 0000   HDL 48 04/14/2020 0838   CHOLHDL 3.2 04/14/2020 0838   LDLCALC 97 04/11/2024 0000   LDLCALC 91 04/14/2020 0838      Other studies Reviewed: Additional studies/ records that were reviewed today include:  Review of the above records demonstrates:       No data to display            ASSESSMENT AND PLAN:    ICD-10-CM   1. SOB (shortness of breath)  R06.02 enalapril  (VASOTEC ) 2.5 MG tablet    2. Sinus bradycardia  R00.1 enalapril  (VASOTEC ) 2.5 MG tablet    3. Mixed hyperlipidemia  E78.2 enalapril  (VASOTEC ) 2.5 MG tablet    4. Essential hypertension, benign  I10 enalapril  (VASOTEC ) 2.5 MG tablet   LVEF 52 %on echo. Thus start small dose enalpril to improve LVEF,otherwise stress test normal LVEF is bordeline.       Problem List Items Addressed This Visit   None Visit Diagnoses       SOB (shortness of breath)    -  Primary   Relevant Medications   enalapril  (VASOTEC ) 2.5 MG tablet     Sinus bradycardia       Relevant Medications   enalapril  (VASOTEC ) 2.5 MG tablet     Mixed hyperlipidemia       Relevant Medications   enalapril  (VASOTEC ) 2.5 MG tablet     Essential hypertension, benign       LVEF 52 %on echo. Thus start small dose enalpril to improve LVEF,otherwise stress test normal LVEF is bordeline.   Relevant Medications   enalapril  (VASOTEC ) 2.5 MG tablet          Disposition:   Return in about 3 months (around 09/25/2024).    Total time spent: 35 minutes  Signed,  Denyse Bathe, MD  06/25/2024 1:22 PM    [facility]

## 2024-07-04 ENCOUNTER — Other Ambulatory Visit

## 2024-07-15 ENCOUNTER — Other Ambulatory Visit: Payer: Self-pay | Admitting: Licensed Practical Nurse

## 2024-07-15 DIAGNOSIS — Z1231 Encounter for screening mammogram for malignant neoplasm of breast: Secondary | ICD-10-CM

## 2024-08-02 ENCOUNTER — Other Ambulatory Visit: Payer: Self-pay | Admitting: Family

## 2024-08-02 ENCOUNTER — Other Ambulatory Visit: Payer: Self-pay | Admitting: Cardiovascular Disease

## 2024-08-14 ENCOUNTER — Encounter

## 2024-08-16 ENCOUNTER — Ambulatory Visit
Admission: RE | Admit: 2024-08-16 | Discharge: 2024-08-16 | Disposition: A | Discharge status: Home / Self Care | Source: Ambulatory Visit | Attending: Licensed Practical Nurse | Admitting: Licensed Practical Nurse

## 2024-08-16 ENCOUNTER — Other Ambulatory Visit: Payer: Self-pay | Admitting: Licensed Practical Nurse

## 2024-08-16 ENCOUNTER — Encounter: Payer: Self-pay | Admitting: Licensed Practical Nurse

## 2024-08-16 DIAGNOSIS — Z1231 Encounter for screening mammogram for malignant neoplasm of breast: Secondary | ICD-10-CM

## 2024-08-20 ENCOUNTER — Ambulatory Visit: Admitting: Podiatry

## 2024-08-20 DIAGNOSIS — M21962 Unspecified acquired deformity of left lower leg: Secondary | ICD-10-CM

## 2024-08-20 DIAGNOSIS — M21961 Unspecified acquired deformity of right lower leg: Secondary | ICD-10-CM

## 2024-08-22 ENCOUNTER — Ambulatory Visit
Admission: RE | Admit: 2024-08-22 | Discharge: 2024-08-22 | Disposition: A | Discharge status: Home / Self Care | Source: Ambulatory Visit | Attending: Licensed Practical Nurse | Admitting: Licensed Practical Nurse

## 2024-08-22 DIAGNOSIS — Z1231 Encounter for screening mammogram for malignant neoplasm of breast: Secondary | ICD-10-CM | POA: Diagnosis present

## 2024-08-22 NOTE — Progress Notes (Signed)
 Orthotics were dispensed they are functioning well no acute complaints if any foot and ankle issues on future she will come back and see us .  Break-in period was discussed

## 2024-08-27 ENCOUNTER — Telehealth: Payer: Self-pay | Admitting: Family

## 2024-08-27 NOTE — Telephone Encounter (Signed)
 Patient left VM to see if she can get something prescribed to help her sleep through the night. She is having episodes where she wakes up and stays up for 3 hours or longer during the night. States she has tried other things but they have not helped. Please advise.

## 2024-09-27 ENCOUNTER — Ambulatory Visit: Admitting: Cardiovascular Disease
